# Patient Record
Sex: Female | Born: 1965 | Race: Black or African American | Hispanic: No | Marital: Single | Smoking: Never smoker
Health system: Southern US, Community
[De-identification: ages and names within clinical notes are randomized; demographics above are authoritative.]

## PROBLEM LIST (undated history)

## (undated) DIAGNOSIS — M1990 Unspecified osteoarthritis, unspecified site: Secondary | ICD-10-CM

## (undated) DIAGNOSIS — R519 Headache, unspecified: Secondary | ICD-10-CM

## (undated) DIAGNOSIS — I10 Essential (primary) hypertension: Secondary | ICD-10-CM

## (undated) HISTORY — DX: Headache, unspecified: R51.9

## (undated) HISTORY — DX: Essential (primary) hypertension: I10

## (undated) HISTORY — DX: Unspecified osteoarthritis, unspecified site: M19.90

## (undated) HISTORY — PX: NO PAST SURGERIES: SHX2092

---

## 2016-03-08 ENCOUNTER — Emergency Department
Admission: EM | Admit: 2016-03-08 | Discharge: 2016-03-08 | Disposition: A | Discharge status: Home / Self Care | Payer: No Typology Code available for payment source | Attending: Emergency Medicine | Admitting: Emergency Medicine

## 2016-03-08 DIAGNOSIS — B349 Viral infection, unspecified: Secondary | ICD-10-CM | POA: Diagnosis not present

## 2016-03-08 DIAGNOSIS — R112 Nausea with vomiting, unspecified: Secondary | ICD-10-CM | POA: Diagnosis present

## 2016-03-08 DIAGNOSIS — N390 Urinary tract infection, site not specified: Secondary | ICD-10-CM | POA: Diagnosis not present

## 2016-03-08 DIAGNOSIS — Z20828 Contact with and (suspected) exposure to other viral communicable diseases: Secondary | ICD-10-CM | POA: Insufficient documentation

## 2016-03-08 DIAGNOSIS — R197 Diarrhea, unspecified: Secondary | ICD-10-CM

## 2016-03-08 LAB — URINALYSIS, COMPLETE (UACMP) WITH MICROSCOPIC
Bilirubin Urine: NEGATIVE
GLUCOSE, UA: NEGATIVE mg/dL
KETONES UR: NEGATIVE mg/dL
Nitrite: NEGATIVE
PROTEIN: 30 mg/dL — AB
Specific Gravity, Urine: 1.028 (ref 1.005–1.030)
pH: 5 (ref 5.0–8.0)

## 2016-03-08 LAB — CBC
HEMATOCRIT: 39.6 % (ref 35.0–47.0)
HEMOGLOBIN: 13.8 g/dL (ref 12.0–16.0)
MCH: 29.7 pg (ref 26.0–34.0)
MCHC: 34.7 g/dL (ref 32.0–36.0)
MCV: 85.6 fL (ref 80.0–100.0)
Platelets: 248 10*3/uL (ref 150–440)
RBC: 4.63 MIL/uL (ref 3.80–5.20)
RDW: 14.5 % (ref 11.5–14.5)
WBC: 5 10*3/uL (ref 3.6–11.0)

## 2016-03-08 LAB — COMPREHENSIVE METABOLIC PANEL
ALT: 19 U/L (ref 14–54)
ANION GAP: 9 (ref 5–15)
AST: 23 U/L (ref 15–41)
Albumin: 4.3 g/dL (ref 3.5–5.0)
Alkaline Phosphatase: 60 U/L (ref 38–126)
BILIRUBIN TOTAL: 0.8 mg/dL (ref 0.3–1.2)
BUN: 12 mg/dL (ref 6–20)
CO2: 28 mmol/L (ref 22–32)
Calcium: 8.8 mg/dL — ABNORMAL LOW (ref 8.9–10.3)
Chloride: 100 mmol/L — ABNORMAL LOW (ref 101–111)
Creatinine, Ser: 0.71 mg/dL (ref 0.44–1.00)
GFR calc Af Amer: >60 mL/min (ref >60)
Glucose, Bld: 113 mg/dL — ABNORMAL HIGH (ref 65–99)
Potassium: 3.7 mmol/L (ref 3.5–5.1)
Sodium: 137 mmol/L (ref 135–145)
TOTAL PROTEIN: 7.9 g/dL (ref 6.5–8.1)

## 2016-03-08 LAB — LIPASE, BLOOD: Lipase: 25 U/L (ref 11–51)

## 2016-03-08 LAB — POCT PREGNANCY, URINE: Preg Test, Ur: NEGATIVE

## 2016-03-08 MED ORDER — ACETAMINOPHEN 325 MG PO TABS
650.0000 mg | ORAL_TABLET | Freq: Once | ORAL | Status: AC
  Administered 2016-03-08: 650 mg via ORAL

## 2016-03-08 MED ORDER — CEPHALEXIN 500 MG PO CAPS: 500.0000 mg | ORAL_CAPSULE | Freq: Three times a day (TID) | ORAL | 0 refills | Status: DC

## 2016-03-08 MED ORDER — ACETAMINOPHEN 325 MG PO TABS
ORAL_TABLET | ORAL | Status: AC
  Filled 2016-03-08: qty 2

## 2016-03-08 MED ORDER — ONDANSETRON 4 MG PO TBDP
4.0000 mg | ORAL_TABLET | Freq: Once | ORAL | Status: AC
  Administered 2016-03-08: 4 mg via ORAL
  Filled 2016-03-08: qty 1

## 2016-03-08 MED ORDER — OSELTAMIVIR PHOSPHATE 75 MG PO CAPS: 75.0000 mg | ORAL_CAPSULE | Freq: Two times a day (BID) | ORAL | 0 refills | Status: DC

## 2016-03-08 MED ORDER — ONDANSETRON 4 MG PO TBDP: 4.0000 mg | ORAL_TABLET | Freq: Three times a day (TID) | ORAL | 0 refills | Status: DC | PRN

## 2016-03-08 NOTE — ED Provider Notes (Signed)
[facility] Emergency Department Provider Note  ____________________________________________  Time seen: Approximately 8:21 AM  I have reviewed the triage vital signs and the nursing notes.   HISTORY  Chief Complaint Abdominal Pain    HPI Melanie Romero is a 51 y.o. female , NAD, presents to the emergency department with 2 day history of nausea, vomiting and diarrhea. Patient states she began to feel ill on Friday evening. Has been exposed to multiple sick contacts at work who have had influenza and GI illnesses. Has had generalized abdominal cramping and upset stomach which can decreased some after having a bowel movement. Bowel movements have been soft and watery without any blood, mucus or foul smell. Has also had nausea with vomiting but has been able to keep down ginger ale without difficulty. Has had some slightly increased urinary frequency but denies dysuria, hematuria, vaginal discharge, pelvic pain. Denies any fevers, chills or body aches. Has not had any nasal congestion, runny nose, cough or chest congestion but does not she's had a mild frontal headache without visual changes. Headache is not the worst headache of her life and did not have a thunderclap onset. Denies chest pain or shortness of breath. Denies any recent antibiotic use or travel.   No past medical history on file.  There are no active problems to display for this patient.   No past surgical history on file.  Prior to Admission medications   Medication Sig Start Date End Date Taking? Authorizing Provider  cephALEXin (KEFLEX) 500 MG capsule Take 1 capsule (500 mg total) by mouth 3 (three) times daily. 03/08/16   Junius Faucett L Cassandr Cederberg, PA-C  ondansetron (ZOFRAN ODT) 4 MG disintegrating tablet Take 1 tablet (4 mg total) by mouth every 8 (eight) hours as needed for nausea or vomiting. 03/08/16   Klever Twyford L Celso Granja, PA-C  oseltamivir (TAMIFLU) 75 MG capsule Take 1 capsule (75 mg total) by mouth 2 (two) times  daily. 03/08/16   Leiland Mihelich L Kaio Kuhlman, PA-C    Allergies Patient has no known allergies.  No family history on file.  Social History Social History  Substance Use Topics  . Smoking status: Not on file  . Smokeless tobacco: Not on file  . Alcohol use Not on file     Review of Systems  Constitutional: No fever/chills Eyes: No visual changes. No discharge ENT: No sore throat, Nasal congestion, runny nose. Cardiovascular: No chest pain. Respiratory: No cough or chest congestion. No shortness of breath. No wheezing.  Gastrointestinal: Positive for abdominal pain, nausea, vomiting and diarrhea.  No constipation. Genitourinary: Positive increased urinary frequency. Negative for dysuria. No hematuria. No pelvic pain, vaginal bleeding or vaginal discharge. No urinary hesitancy, urgency. Musculoskeletal: Negative for general myalgias.  Skin: Negative for rash. Neurological: Positive for headaches, but no focal weakness or numbness. 10-point ROS otherwise negative.  ____________________________________________   PHYSICAL EXAM:  VITAL SIGNS: ED Triage Vitals  Enc Vitals Group     BP 03/08/16 0711 (!) 141/82     Pulse Rate 03/08/16 0711 81     Resp 03/08/16 0711 18     Temp 03/08/16 0711 99.3 F (37.4 C)     Temp Source 03/08/16 0711 Oral     SpO2 03/08/16 0711 99 %     Weight 03/08/16 0712 219 lb (99.3 kg)     Height 03/08/16 0712 5' 5.5" (1.664 m)     Head Circumference --      Peak Flow --      Pain Score  03/08/16 0714 8     Pain Loc --      Pain Edu? --      Excl. in GC? --      Constitutional: Alert and oriented. Well appearing and in no acute distress. Eyes: Conjunctivae are normal Without icterus, injection or discharge Head: Atraumatic. ENT:      Nose: No congestion/rhinnorhea.      Mouth/Throat: Mucous membranes are moist.  Neck: Supple with full range of motion. Hematological/Lymphatic/Immunilogical: No cervical lymphadenopathy. Cardiovascular: Normal rate,  regular rhythm. Normal S1 and S2.  Good peripheral circulation. Respiratory: Normal respiratory effort without tachypnea or retractions. Lungs CTAB with breath sounds noted in all lung fields. No wheeze, rhonchi, rales. Gastrointestinal: Soft and nontender without distention or guarding in all quadrants. No rebound or rigidity. No masses. Bowel sounds grossly normal active in all quadrants. Musculoskeletal: No lower extremity tenderness nor edema.  No joint effusions. Neurologic:  Normal speech and language. No gross focal neurologic deficits are appreciated.  Skin:  Skin is warm, dry and intact. No rash noted. Psychiatric: Mood and affect are normal. Speech and behavior are normal. Patient exhibits appropriate insight and judgement.   ____________________________________________   LABS (all labs ordered are listed, but only abnormal results are displayed)  Labs Reviewed  COMPREHENSIVE METABOLIC PANEL - Abnormal; Notable for the following:       Result Value   Chloride 100 (*)    Glucose, Bld 113 (*)    Calcium 8.8 (*)    All other components within normal limits  URINALYSIS, COMPLETE (UACMP) WITH MICROSCOPIC - Abnormal; Notable for the following:    Color, Urine YELLOW (*)    APPearance HAZY (*)    Hgb urine dipstick SMALL (*)    Protein, ur 30 (*)    Leukocytes, UA LARGE (*)    Bacteria, UA RARE (*)    Squamous Epithelial / LPF 6-30 (*)    All other components within normal limits  URINE CULTURE  LIPASE, BLOOD  CBC  POC URINE PREG, ED  POCT PREGNANCY, URINE   ____________________________________________  EKG  EKG reveals normal sinus rhythm with a ventricular rate of 89 beats per minute. No acute changes or evidence of STEMI. EKG also reviewed by Dr. Gladstone Pih. ____________________________________________  RADIOLOGY  None ____________________________________________    PROCEDURES  Procedure(s) performed: None   Procedures   Medications  ondansetron  (ZOFRAN-ODT) disintegrating tablet 4 mg (4 mg Oral Given 03/08/16 0845)     ____________________________________________   INITIAL IMPRESSION / ASSESSMENT AND PLAN / ED COURSE  Pertinent labs & imaging results that were available during my care of the patient were reviewed by me and considered in my medical decision making (see chart for details).     Patient's diagnosis is consistent with Nausea, vomiting and diarrhea due to viral illness, exposure to flu and UTI. Patient's lab work returned without any significant abnormality. Urinalysis did show leukocytes with rare bacteria and coupled with the patient's increased urinary frequency, abdominal discomfort I thought it best to cover for potential UTI. Urine culture has been ordered and results will be called to the patient when available.  Patient will be discharged home with prescriptions for Keflex, Zofran ODT and Tamiflu to take as directed. Patient is to follow up with [facility] if symptoms persist past this treatment course. Patient is given ED precautions to return to the ED for any worsening or new symptoms.   ____________________________________________  FINAL CLINICAL IMPRESSION(S) / ED DIAGNOSES  Final  diagnoses:  Nausea vomiting and diarrhea  Urinary tract infection without hematuria, site unspecified  Viral illness  Exposure to the flu      NEW MEDICATIONS STARTED DURING THIS VISIT:  New Prescriptions   CEPHALEXIN (KEFLEX) 500 MG CAPSULE    Take 1 capsule (500 mg total) by mouth 3 (three) times daily.   ONDANSETRON (ZOFRAN ODT) 4 MG DISINTEGRATING TABLET    Take 1 tablet (4 mg total) by mouth every 8 (eight) hours as needed for nausea or vomiting.   OSELTAMIVIR (TAMIFLU) 75 MG CAPSULE    Take 1 capsule (75 mg total) by mouth 2 (two) times daily.         Hope Pigeon, PA-C 03/08/16 1610    Sharyn Creamer, MD 03/08/16 [phone]

## 2016-03-08 NOTE — ED Triage Notes (Signed)
Pt reports vomiting, diarrhea since Friday. Pt also reports upper abd pain and lower back pain. Denies urinary complaints.

## 2016-03-08 NOTE — ED Notes (Signed)
ED EKG was signed by Dr. Pershing ProudSchaevitz

## 2016-03-08 NOTE — ED Notes (Signed)

## 2016-03-10 LAB — URINE CULTURE: Special Requests: NORMAL

## 2018-06-08 ENCOUNTER — Other Ambulatory Visit (HOSPITAL_COMMUNITY): Payer: Self-pay | Admitting: *Deleted

## 2018-06-08 DIAGNOSIS — Z1231 Encounter for screening mammogram for malignant neoplasm of breast: Secondary | ICD-10-CM

## 2018-07-07 ENCOUNTER — Other Ambulatory Visit: Payer: Self-pay | Admitting: Family Medicine

## 2018-07-07 ENCOUNTER — Other Ambulatory Visit: Payer: Self-pay

## 2018-07-07 ENCOUNTER — Ambulatory Visit
Admission: RE | Admit: 2018-07-07 | Discharge: 2018-07-07 | Disposition: A | Discharge status: Home / Self Care | Payer: No Typology Code available for payment source | Source: Ambulatory Visit | Attending: Family Medicine | Admitting: Family Medicine

## 2018-07-07 DIAGNOSIS — M25562 Pain in left knee: Secondary | ICD-10-CM

## 2018-08-30 ENCOUNTER — Ambulatory Visit (HOSPITAL_COMMUNITY): Payer: No Typology Code available for payment source

## 2018-08-31 ENCOUNTER — Other Ambulatory Visit (HOSPITAL_COMMUNITY): Payer: Self-pay | Admitting: *Deleted

## 2018-08-31 DIAGNOSIS — Z1231 Encounter for screening mammogram for malignant neoplasm of breast: Secondary | ICD-10-CM

## 2018-12-15 ENCOUNTER — Ambulatory Visit (HOSPITAL_COMMUNITY): Payer: No Typology Code available for payment source

## 2019-03-02 ENCOUNTER — Ambulatory Visit (HOSPITAL_COMMUNITY): Payer: No Typology Code available for payment source

## 2019-11-22 ENCOUNTER — Other Ambulatory Visit: Payer: Self-pay

## 2019-11-22 ENCOUNTER — Other Ambulatory Visit: Payer: Self-pay | Admitting: Family Medicine

## 2019-11-22 ENCOUNTER — Ambulatory Visit
Admission: RE | Admit: 2019-11-22 | Discharge: 2019-11-22 | Disposition: A | Discharge status: Home / Self Care | Payer: No Typology Code available for payment source | Source: Ambulatory Visit | Attending: Family Medicine | Admitting: Family Medicine

## 2019-11-22 DIAGNOSIS — M542 Cervicalgia: Secondary | ICD-10-CM

## 2020-04-11 ENCOUNTER — Other Ambulatory Visit: Payer: Self-pay | Admitting: Student in an Organized Health Care Education/Training Program

## 2020-04-11 DIAGNOSIS — G43909 Migraine, unspecified, not intractable, without status migrainosus: Secondary | ICD-10-CM

## 2020-04-26 ENCOUNTER — Ambulatory Visit
Admission: RE | Admit: 2020-04-26 | Discharge: 2020-04-26 | Disposition: A | Discharge status: Home / Self Care | Payer: No Typology Code available for payment source | Source: Ambulatory Visit | Attending: Student in an Organized Health Care Education/Training Program | Admitting: Student in an Organized Health Care Education/Training Program

## 2020-04-26 DIAGNOSIS — G43909 Migraine, unspecified, not intractable, without status migrainosus: Secondary | ICD-10-CM

## 2020-05-28 ENCOUNTER — Other Ambulatory Visit: Payer: Self-pay | Admitting: Student in an Organized Health Care Education/Training Program

## 2020-05-28 ENCOUNTER — Encounter: Payer: Self-pay | Admitting: Neurology

## 2020-05-28 DIAGNOSIS — I83813 Varicose veins of bilateral lower extremities with pain: Secondary | ICD-10-CM

## 2020-05-31 ENCOUNTER — Inpatient Hospital Stay: Admission: RE | Admit: 2020-05-31 | Payer: No Typology Code available for payment source | Source: Ambulatory Visit

## 2020-06-10 ENCOUNTER — Ambulatory Visit
Admission: RE | Admit: 2020-06-10 | Discharge: 2020-06-10 | Disposition: A | Discharge status: Home / Self Care | Payer: No Typology Code available for payment source | Source: Ambulatory Visit | Attending: Student in an Organized Health Care Education/Training Program | Admitting: Student in an Organized Health Care Education/Training Program

## 2020-06-10 DIAGNOSIS — I83813 Varicose veins of bilateral lower extremities with pain: Secondary | ICD-10-CM

## 2020-08-06 NOTE — Progress Notes (Signed)
NEUROLOGY CONSULTATION NOTE  Melanie Romero MRN: [id number] DOB: 11-04-1965  Referring provider: [name], NP Primary care provider: [name], NP  Reason for consult:  migraine  Assessment/Plan:   Chronic migraine without aura, without status migrainosus, not intractable - complicated by medication-overuse.  Improvement likely due to discontinuing Excedrin and optimizing blood pressure control.   Would not start a preventative at this time  Limit use of pain relievers to no more than 2 days out of week to prevent risk of rebound or medication-overuse headache. Manage life style modification:  caffeine cessation, increased water intake, not to skip meals, monitor for triggers, routine exercise, proper sleep hygiene If headaches increase, follow up.   Subjective:  Melanie Romero is a 55 year old right-handed female with HTN, cervical degenerative disc disease who presents for migraines.  History supplemented by referring provider's note.  She has had migraines since her early 74s.  They are a severe pounding pain across the forehead and then moved to crown and right side of head.  No aura.  Associated with photophobia, phonophobia and mild blurred vision but no nausea, vomiting, numbness or tingling.  Will take Excedrin immediately and last 6 hours.  They have been daily since her mid-40s.  Taking Excedrin daily.  No known triggers.  Nothing specifically relieves them other than Excedrin.  She had a CT of head on 04/26/2020 personally reviewed showed incidental scattered dural calcifications but overall unremarkable.    Stopped Excedrin a month ago.  Started amlodipine two weeks ago and hasn't had a migraine since.  She has history of degenerative disease of the cervical spine.  Cervical spine X-ray on 11/22/2019 personally reviewed showed mild degenerative facet disease bilaterally with early multilevel neural foraminal narrowing from C3-4 through C5-6.  Current  NSAIDS/analgesics:  Excedrin migraine, meloxicam, Norco Current triptans:  none Current ergotamine:  none Current anti-emetic:  none Current muscle relaxants:  tizanidine 4mg  QD PRN Current Antihypertensive medications:  amlodipine, HCTZ Current Antidepressant medications:  none Current Anticonvulsant medications:  gabapentin 300mg  QD Current anti-CGRP:  none Current Vitamins/Herbal/Supplements:  MVI, fish oil, MagOx 400mg  QD Current Antihistamines/Decongestants:  none Other therapy:  none Hormone/birth control:  none  Past NSAIDS/analgesics:  none Past abortive triptans:  none Past abortive ergotamine:  none Past muscle relaxants:  none Past anti-emetic:  Zofran Past antihypertensive medications:  none Past antidepressant medications:  ne Past anticonvulsant medications:  none Past anti-CGRP:  none Past vitamins/Herbal/Supplements:  none Past antihistamines/decongestants:  none Other past therapies:  none  Caffeine:  2 cups coffee.  Drinks energy drinks twice a week Diet:  Sometimes skips meals.  Not enough water Exercise:  No Depression:  No.  Last year felt depressed after getting COVID;  Other pain:  Chronic bilateral knee pain/osteoarthritis Sleep hygiene:  Poor.  Problems falling asleep.  Works 12 hour shift overnight 4 times out of week.   Family history of headache:  Not that she is aware of      PAST MEDICAL HISTORY: Past Medical History:  Diagnosis Date   Arthritis    Bilateral legs   Headache    Hypertension    MEDICATIONS: Current Outpatient Medications on File Prior to Visit  Medication Sig Dispense Refill   cephALEXin (KEFLEX) 500 MG capsule Take 1 capsule (500 mg total) by mouth 3 (three) times daily. 21 capsule 0   ondansetron (ZOFRAN ODT) 4 MG disintegrating tablet Take 1 tablet (4 mg total) by mouth every 8 (eight) hours as needed for nausea  or vomiting. 20 tablet 0   oseltamivir (TAMIFLU) 75 MG capsule Take 1 capsule (75 mg total) by mouth 2 (two)  times daily. 10 capsule 0   No current facility-administered medications on file prior to visit.    ALLERGIES: No Known Allergies  FAMILY HISTORY: No family history on file.  Objective:  Blood pressure 114/80, pulse 87, height 5\' 6"  (1.676 m), weight 235 lb 12.8 oz (107 kg), SpO2 98 %. General: No acute distress.  Patient appears well-groomed.   Head:  Normocephalic/atraumatic Eyes:  fundi examined but not visualized Neck: supple, no paraspinal tenderness, full range of motion Back: No paraspinal tenderness Heart: regular rate and rhythm Lungs: Clear to auscultation bilaterally. Vascular: No carotid bruits. Neurological Exam: Mental status: alert and oriented to person, place, and time, recent and remote memory intact, fund of knowledge intact, attention and concentration intact, speech fluent and not dysarthric, language intact. Cranial nerves: CN I: not tested CN II: pupils equal, round and reactive to light, visual fields intact CN III, IV, VI:  full range of motion, no nystagmus, no ptosis CN V: facial sensation intact. CN VII: upper and lower face symmetric CN VIII: hearing intact CN IX, X: gag intact, uvula midline CN XI: sternocleidomastoid and trapezius muscles intact CN XII: tongue midline Bulk & Tone: normal, no fasciculations. Motor:  muscle strength 5/5 throughout Sensation:  Pinprick, temperature and vibratory sensation intact. Deep Tendon Reflexes:  2+ throughout,  toes downgoing.   Finger to nose testing:  Without dysmetria.   Heel to shin:  Without dysmetria.   Gait:  Normal station and stride.  Romberg negative.    Thank you for allowing me to take part in the care of this patient.  , DO  CC: Shon Millet, NP

## 2020-08-07 ENCOUNTER — Ambulatory Visit (INDEPENDENT_AMBULATORY_CARE_PROVIDER_SITE_OTHER): Payer: No Typology Code available for payment source | Admitting: Neurology

## 2020-08-07 ENCOUNTER — Encounter: Payer: Self-pay | Admitting: Neurology

## 2020-08-07 ENCOUNTER — Other Ambulatory Visit: Payer: Self-pay

## 2020-08-07 VITALS — BP 114/80 | HR 87 | Ht 66.0 in | Wt 235.8 lb

## 2020-08-07 DIAGNOSIS — G4440 Drug-induced headache, not elsewhere classified, not intractable: Secondary | ICD-10-CM

## 2020-08-07 DIAGNOSIS — G43709 Chronic migraine without aura, not intractable, without status migrainosus: Secondary | ICD-10-CM

## 2020-08-07 NOTE — Patient Instructions (Signed)
  Limit use of pain relievers to no more than 2 days out of the week.  These medications include acetaminophen, NSAIDs (ibuprofen/Advil/Motrin, naproxen/Aleve, triptans (Imitrex/sumatriptan), Excedrin, and narcotics.  This will help reduce risk of rebound headaches. Be aware of common food triggers:  - Caffeine:  coffee, black tea, cola, Mt. Dew  - Chocolate  - Dairy:  aged cheeses (brie, blue, cheddar, gouda, Parmasan, provolone, romano, Swiss, etc), chocolate milk, buttermilk, sour cream, limit eggs and yogurt  - Nuts, peanut butter  - Alcohol  - Cereals/grains:  FRESH breads (fresh bagels, sourdough, doughnuts), yeast productions  - Processed/canned/aged/cured meats (pre-packaged deli meats, hotdogs)  - MSG/glutamate:  soy sauce, flavor enhancer, pickled/preserved/marinated foods  - Sweeteners:  aspartame (Equal, Nutrasweet).  Sugar and Splenda are okay  - Vegetables:  legumes (lima beans, lentils, snow peas, fava beans, pinto peans, peas, garbanzo beans), sauerkraut, onions, olives, pickles  - Fruit:  avocados, bananas, citrus fruit (orange, lemon, grapefruit), mango  - Other:  Frozen meals, macaroni and cheese Routine exercise Stay adequately hydrated (aim for 64 oz water daily) Keep headache diary Maintain proper stress management Maintain proper sleep hygiene Do not skip meals Consider supplements:  magnesium citrate 400mg daily, riboflavin 400mg daily, coenzyme Q10 100mg three times daily.  

## 2020-10-18 ENCOUNTER — Other Ambulatory Visit: Payer: Self-pay

## 2020-10-18 DIAGNOSIS — I83813 Varicose veins of bilateral lower extremities with pain: Secondary | ICD-10-CM

## 2020-10-28 NOTE — Progress Notes (Signed)
[facility] [place]  ASSESSMENT / PLAN: Melanie Romero is a 55 y.o. female with chronic venous insufficiency of bilateral lower extremity causing edema, varicose and reticular veins (C3 disease).  Venous duplex is significant for right GSV reflux, but vein does not appear amenable to ablation based on size.  Recommend compression and elevation for symptomatic relief. Follow up with me as needed.  CHIEF COMPLAINT: leg swelling and fatigue  HISTORY OF PRESENT ILLNESS: Melanie Romero is a 55 y.o. female who presents to clinic for evaluation of leg discomfort.  The patient reports her legs feel heavy and tired at the end of the day.  She experiences worsening swelling as the day goes on.  She has tried compression garments over the past several weeks with some relief.  She has noticed scattered varicosities and reticular veins across her legs bilaterally.  Past Medical History:  Diagnosis Date   Arthritis    Bilateral legs   Headache    Hypertension     History reviewed. No pertinent surgical history.  Family History  Problem Relation Age of Onset   Stroke Mother    Stroke Father    Neuropathy Sister    Stroke Brother     Social History   Socioeconomic History   Marital status: Single    Spouse name: Not on file   Number of children: Not on file   Years of education: Not on file   Highest education level: Not on file  Occupational History   Not on file  Tobacco Use   Smoking status: Never   Smokeless tobacco: Never  Vaping Use   Vaping Use: Never used  Substance and Sexual Activity   Alcohol use: Never   Drug use: Never   Sexual activity: Not on file  Other Topics Concern   Not on file  Social History Narrative   Not on file   Social Determinants of Health   Financial Resource Strain: Not on file  Food Insecurity: Not on file  Transportation Needs: Not on file  Physical Activity: Not on file  Stress: Not on file  Social Connections: Not  on file  Intimate Partner Violence: Not on file    No Known Allergies  Current Outpatient Medications  Medication Sig Dispense Refill   amLODipine (NORVASC) 5 MG tablet Take 5 mg by mouth daily.     gabapentin (NEURONTIN) 300 MG capsule Take 300 mg by mouth daily.     hydrochlorothiazide (HYDRODIURIL) 50 MG tablet Take 50 mg by mouth daily.     HYDROcodone-acetaminophen (NORCO) 7.5-325 MG tablet Take 1 tablet by mouth every 4 (four) hours as needed.     meloxicam (MOBIC) 15 MG tablet Take 15 mg by mouth daily.     No current facility-administered medications for this visit.    REVIEW OF SYSTEMS:  [X]  denotes positive finding, [ ]  denotes negative finding Cardiac  Comments:  Chest pain or chest pressure:    Shortness of breath upon exertion:    Short of breath when lying flat:    Irregular heart rhythm:        Vascular    Pain in calf, thigh, or hip brought on by ambulation:    Pain in feet at night that wakes you up from your sleep:     Blood clot in your veins:    Leg swelling:         Pulmonary    Oxygen at home:    Productive cough:  Wheezing:         Neurologic    Sudden weakness in arms or legs:     Sudden numbness in arms or legs:     Sudden onset of difficulty speaking or slurred speech:    Temporary loss of vision in one eye:     Problems with dizziness:         Gastrointestinal    Blood in stool:     Vomited blood:         Genitourinary    Burning when urinating:     Blood in urine:        Psychiatric    Major depression:         Hematologic    Bleeding problems:    Problems with blood clotting too easily:        Skin    Rashes or ulcers:        Constitutional    Fever or chills:      PHYSICAL EXAM Vitals:   10/29/20 1300  BP: 126/83  Pulse: 70  Resp: 20  Temp: 98.1 F (36.7 C)  SpO2: 100%  Weight: 232 lb (105.2 kg)  Height: 5\' 6"  (1.676 m)    Constitutional: well appearing. no distress. Appears well nourished.  Neurologic: CN  intact. no focal findings. no sensory loss. Psychiatric:  Mood and affect symmetric and appropriate. Eyes:  No icterus. No conjunctival pallor. Ears, nose, throat:  mucous membranes moist. Midline trachea.  Cardiac: regular rate and rhythm.  Respiratory:  unlabored. Abdominal:  soft, non-tender, non-distended.  Peripheral vascular: 2+ DP pulses.  Small, scattered varicosities and reticular veins across the thighs and calves bilaterally. Extremity: no edema. no cyanosis. no pallor.  Skin: no gangrene. no ulceration.  Lymphatic: no Stemmer's sign. no palpable lymphadenopathy.  PERTINENT LABORATORY AND RADIOLOGIC DATA  Most recent CBC CBC Latest Ref Rng & Units 03/08/2016  WBC 3.6 - 11.0 K/uL 5.0  Hemoglobin 12.0 - 16.0 g/dL 05/06/2016  Hematocrit 83.1 - 47.0 % 39.6  Platelets 150 - 440 K/uL 248     Most recent CMP CMP Latest Ref Rng & Units 03/08/2016  Glucose 65 - 99 mg/dL 05/06/2016)  BUN 6 - 20 mg/dL 12  Creatinine 616(W - 7.37 mg/dL 1.06  Sodium 2.69 - 485 mmol/L 137  Potassium 3.5 - 5.1 mmol/L 3.7  Chloride 101 - 111 mmol/L 100(L)  CO2 22 - 32 mmol/L 28  Calcium 8.9 - 10.3 mg/dL 462)  Total Protein 6.5 - 8.1 g/dL 7.9  Total Bilirubin 0.3 - 1.2 mg/dL 0.8  Alkaline Phos 38 - 126 U/L 60  AST 15 - 41 U/L 23  ALT 14 - 54 U/L 19    Lower Venous Reflux Study   Patient Name:  Melanie Romero  Date of Exam:   10/29/2020  Medical Rec #: 10/31/2020      Accession #:    [id number]  Date of Birth: 1965-04-14      Patient Gender: F  Patient Age:   89 years  Exam Location:  [facility]  Procedure:      VAS [name] LOWER EXTREMITY VENOUS REFLUX  Referring Phys: [place]    ---------------------------------------------------------------------------  -----     Indications: Varicosities.  Other Indications: Heaviness and pain with walking.   Performing Technologist: [name] RVT      Examination Guidelines: A complete evaluation includes B-mode imaging,  spectral   Doppler, color Doppler, and power Doppler as needed of all accessible  portions  of each vessel. Bilateral testing is considered an integral part of a  complete  examination. Limited examinations for reoccurring indications may be  performed  as noted. The reflux portion of the exam is performed with the patient in  reverse Trendelenburg.  Significant venous reflux is defined as >500 ms in the superficial venous  system, and >1 second in the deep venous system.      Venous Reflux Times  +--------------+---------+------+-----------+------------+-------------+  RIGHT         Reflux NoRefluxReflux TimeDiameter cmsComments                               Yes                                        +--------------+---------+------+-----------+------------+-------------+  CFV                     yes   >1 second                            +--------------+---------+------+-----------+------------+-------------+  FV mid        no                                                   +--------------+---------+------+-----------+------------+-------------+  Popliteal     no                                                   +--------------+---------+------+-----------+------------+-------------+  GSV at SFJ              yes                 0.47                   +--------------+---------+------+-----------+------------+-------------+  GSV prox thighno                            0.48                   +--------------+---------+------+-----------+------------+-------------+  GSV mid thigh no                            0.33                   +--------------+---------+------+-----------+------------+-------------+  GSV dist thigh          yes    >500 ms      0.39    out of fascia  +--------------+---------+------+-----------+------------+-------------+  GSV at knee             yes    >500 ms      0.21                    +--------------+---------+------+-----------+------------+-------------+  GSV prox calf  NV             +--------------+---------+------+-----------+------------+-------------+  GSV mid calf                                        NV             +--------------+---------+------+-----------+------------+-------------+  SSV Pop Fossa no                            0.39                   +--------------+---------+------+-----------+------------+-------------+  SSV prox calf no                            0.33                   +--------------+---------+------+-----------+------------+-------------+  SSV mid calf  no                            0.35                   +--------------+---------+------+-----------+------------+-------------+  AASV prx      no                            0.37    tortuous       +--------------+---------+------+-----------+------------+-------------+  AASV mid                yes    >500 ms      0.39                   +--------------+---------+------+-----------+------------+-------------+  AASV m-d                yes    >500 ms                             +--------------+---------+------+-----------+------------+-------------+      +--------------+---------+------+-----------+------------+--------+  LEFT          Reflux NoRefluxReflux TimeDiameter cmsComments                          Yes                                   +--------------+---------+------+-----------+------------+--------+  CFV                     yes   >1 second                       +--------------+---------+------+-----------+------------+--------+  FV mid        no                                              +--------------+---------+------+-----------+------------+--------+  Popliteal     no                                               +--------------+---------+------+-----------+------------+--------+  GSV at [facility]    no                            0.56              +--------------+---------+------+-----------+------------+--------+  GSV prox thighno                            0.46              +--------------+---------+------+-----------+------------+--------+  GSV mid thigh no                            0.34              +--------------+---------+------+-----------+------------+--------+  GSV dist thighno                            0.39              +--------------+---------+------+-----------+------------+--------+  GSV at knee   no                            0.35              +--------------+---------+------+-----------+------------+--------+  GSV prox calf no                            0.24    NV        +--------------+---------+------+-----------+------------+--------+  GSV mid calf  no                            0.23              +--------------+---------+------+-----------+------------+--------+  SSV Pop Fossa no                            0.31              +--------------+---------+------+-----------+------------+--------+  SSV prox calf no                            0.18              +--------------+---------+------+-----------+------------+--------+  SSV mid calf  no                            0.26              +--------------+---------+------+-----------+------------+--------+  AASV p        no                            0.27              +--------------+---------+------+-----------+------------+--------+          Summary:  Right:  - No evidence of deep vein thrombosis seen in the right lower extremity,  from the common femoral through the popliteal veins.  - No evidence of superficial venous reflux seen in the right short  saphenous vein.  - Venous reflux is noted in the right common femoral vein.  - Venous reflux is noted in  the right sapheno-femoral junction.  -  Venous reflux is noted in the right greater saphenous vein in the thigh  to medial knee.   - Venous reflux is noted in the AASV mid thigh area.     Left:  - No evidence of deep vein thrombosis seen in the left lower extremity,  from the common femoral through the popliteal veins.  - No evidence of superficial venous reflux seen in the left greater  saphenous vein.  - No evidence of superficial venous reflux seen in the left short  saphenous vein.  - Venous reflux is noted in the left common femoral vein.     *See table(s) above for measurements and observations.   Rande Brunt. Lenell Antu, MD [facility] Phone Number: [phone] 10/29/2020 2:41 PM  Total time spent on preparing this encounter including chart review, data review, collecting history, examining the patient, coordinating care for this new patient, 45 minutes.  Portions of this report may have been transcribed using voice recognition software.  Every effort has been made to ensure accuracy; however, inadvertent computerized transcription errors may still be present.

## 2020-10-29 ENCOUNTER — Other Ambulatory Visit: Payer: Self-pay

## 2020-10-29 ENCOUNTER — Encounter: Payer: Self-pay | Admitting: Vascular Surgery

## 2020-10-29 ENCOUNTER — Ambulatory Visit (HOSPITAL_COMMUNITY)
Admission: RE | Admit: 2020-10-29 | Discharge: 2020-10-29 | Disposition: A | Discharge status: Home / Self Care | Payer: No Typology Code available for payment source | Source: Ambulatory Visit | Attending: Vascular Surgery | Admitting: Vascular Surgery

## 2020-10-29 ENCOUNTER — Ambulatory Visit (INDEPENDENT_AMBULATORY_CARE_PROVIDER_SITE_OTHER): Payer: No Typology Code available for payment source | Admitting: Vascular Surgery

## 2020-10-29 VITALS — BP 126/83 | HR 70 | Temp 98.1°F | Resp 20 | Ht 66.0 in | Wt 232.0 lb

## 2020-10-29 DIAGNOSIS — I872 Venous insufficiency (chronic) (peripheral): Secondary | ICD-10-CM

## 2020-10-29 DIAGNOSIS — I83813 Varicose veins of bilateral lower extremities with pain: Secondary | ICD-10-CM | POA: Insufficient documentation

## 2021-05-26 ENCOUNTER — Telehealth: Payer: Self-pay

## 2021-05-26 NOTE — Telephone Encounter (Signed)
NOTES SCANNED TO REFERRAL 

## 2021-06-04 ENCOUNTER — Ambulatory Visit: Payer: No Typology Code available for payment source | Admitting: Internal Medicine

## 2021-06-04 NOTE — Progress Notes (Deleted)
Cardiology Office Note:    Date:  06/04/2021   ID:  Melanie Romero, DOB [birth year], MRN [id number]  PCP:  [name], NP (Inactive)   [facility] Providers Cardiologist:  None { Click to update primary MD,subspecialty MD or APP then REFRESH:1}    Referring MD: Laruth Bouchard, MD   No chief complaint on file. HTN, family hx of CAD  History of Present Illness:    Melanie Romero is a 56 y.o. female with a hx of HTN, family hx of CAD     Past Medical History:  Diagnosis Date   Arthritis    Bilateral legs   Headache    Hypertension     No past surgical history on file.  Current Medications: No outpatient medications have been marked as taking for the 06/04/21 encounter (Appointment) with Maisie Fus, MD.     Allergies:   Patient has no known allergies.   Social History   Socioeconomic History   Marital status: Single    Spouse name: Not on file   Number of children: Not on file   Years of education: Not on file   Highest education level: Not on file  Occupational History   Not on file  Tobacco Use   Smoking status: Never   Smokeless tobacco: Never  Vaping Use   Vaping Use: Never used  Substance and Sexual Activity   Alcohol use: Never   Drug use: Never   Sexual activity: Not on file  Other Topics Concern   Not on file  Social History Narrative   Not on file   Social Determinants of Health   Financial Resource Strain: Not on file  Food Insecurity: Not on file  Transportation Needs: Not on file  Physical Activity: Not on file  Stress: Not on file  Social Connections: Not on file     Family History: The patient's ***family history includes Neuropathy in her sister; Stroke in her brother, father, and mother.  ROS:   Please see the history of present illness.    *** All other systems reviewed and are negative.  EKGs/Labs/Other Studies Reviewed:    The following studies were reviewed today: ***  EKG:  EKG is *** ordered today.  The ekg  ordered today demonstrates ***  Recent Labs: No results found for requested labs within last 8760 hours.  Recent Lipid Panel No results found for: CHOL, TRIG, HDL, CHOLHDL, VLDL, LDLCALC, LDLDIRECT   Risk Assessment/Calculations:   {Does this patient have ATRIAL FIBRILLATION?:[phone]}       Physical Exam:    VS:  There were no vitals taken for this visit.    Wt Readings from Last 3 Encounters:  10/29/20 232 lb (105.2 kg)  08/07/20 235 lb 12.8 oz (107 kg)  03/08/16 219 lb (99.3 kg)     GEN: *** Well nourished, well developed in no acute distress HEENT: Normal NECK: No JVD; No carotid bruits LYMPHATICS: No lymphadenopathy CARDIAC: ***RRR, no murmurs, rubs, gallops RESPIRATORY:  Clear to auscultation without rales, wheezing or rhonchi  ABDOMEN: Soft, non-tender, non-distended MUSCULOSKELETAL:  No edema; No deformity  SKIN: Warm and dry NEUROLOGIC:  Alert and oriented x 3 PSYCHIATRIC:  Normal affect   ASSESSMENT:    No diagnosis found. PLAN:    In order of problems listed above:  ***      {Are you ordering a CV Procedure (e.g. stress test, cath, DCCV, TEE, etc)?   Press F2        :[id number]}  Medication Adjustments/Labs and Tests Ordered: Current medicines are reviewed at length with the patient today.  Concerns regarding medicines are outlined above.  No orders of the defined types were placed in this encounter.  No orders of the defined types were placed in this encounter.   There are no Patient Instructions on file for this visit.   Signed, Maisie Fus, MD  06/04/2021 8:29 AM    [facility]

## 2021-06-09 ENCOUNTER — Ambulatory Visit (INDEPENDENT_AMBULATORY_CARE_PROVIDER_SITE_OTHER): Payer: No Typology Code available for payment source | Admitting: Internal Medicine

## 2021-06-09 ENCOUNTER — Encounter: Payer: Self-pay | Admitting: Internal Medicine

## 2021-06-09 VITALS — BP 112/76 | HR 85 | Ht 66.0 in | Wt 227.8 lb

## 2021-06-09 DIAGNOSIS — Z8249 Family history of ischemic heart disease and other diseases of the circulatory system: Secondary | ICD-10-CM

## 2021-06-09 DIAGNOSIS — Z13220 Encounter for screening for lipoid disorders: Secondary | ICD-10-CM

## 2021-06-09 NOTE — Progress Notes (Signed)
?Cardiology Office Note:   ? ?Date:  06/09/2021  ? ?ID:  Melanie Romero, DOB 1965/10/11, MRN [id number] ? ?PCP:  [name], NP (Inactive) ?  ?[facility] Providers ?Cardiologist:  None    ? ?Referring MD: Laruth Bouchard, MD  ? ?No chief complaint on file. ?HTN, family hx of CAD ? ?History of Present Illness:   ? ?Melanie Romero is a 56 y.o. female with a hx of arthritis, migraine, referral for HTN and family hx of CAD ? ?Her brother died of heart failure at 2. He had significant CAD. Her oldest brother had heart failure ; he had diabetes, and hypertension as well as a stroke. Died at 3. Mother died at 72 years old, unknown cause.; noted to have heart disease. Her sister has cardiomyopathy.  ? ?She denies angina, dyspnea on exertion, lower extremity edema, PND or orthopnea.  She notes occasional palpitations. This occurs every once in a while. 1-2x per month. She gets left arm tingling. No syncope. Notes loud snoring. ? ? ?Past Medical History:  ?Diagnosis Date  ? Arthritis   ? Bilateral legs  ? Headache   ? Hypertension   ? ? ?No past surgical history on file. ? ?Current Medications: ?Current Outpatient Medications on File Prior to Visit  ?Medication Sig Dispense Refill  ? amLODipine (NORVASC) 5 MG tablet Take 5 mg by mouth daily.    ? gabapentin (NEURONTIN) 300 MG capsule Take 300 mg by mouth daily.    ? hydrochlorothiazide (HYDRODIURIL) 50 MG tablet Take 50 mg by mouth daily.    ? HYDROcodone-acetaminophen (NORCO) 7.5-325 MG tablet Take 1 tablet by mouth every 4 (four) hours as needed.    ? ibuprofen (ADVIL) 800 MG tablet ibuprofen 800 mg tablet ? TAKE 1 TABLET 3 TIMES A DAY BY ORAL ROUTE.    ? meloxicam (MOBIC) 15 MG tablet Take 15 mg by mouth daily.    ? tiZANidine (ZANAFLEX) 4 MG tablet Take 4 mg by mouth daily as needed.    ? ?No current facility-administered medications on file prior to visit.  ? ? ? ? ?Allergies:   Patient has no known allergies.  ? ?Social History  ? ?Socioeconomic History  ? Marital  status: Single  ?  Spouse name: Not on file  ? Number of children: Not on file  ? Years of education: Not on file  ? Highest education level: Not on file  ?Occupational History  ? Not on file  ?Tobacco Use  ? Smoking status: Never  ? Smokeless tobacco: Never  ?Vaping Use  ? Vaping Use: Never used  ?Substance and Sexual Activity  ? Alcohol use: Never  ? Drug use: Never  ? Sexual activity: Not on file  ?Other Topics Concern  ? Not on file  ?Social History Narrative  ? Not on file  ? ?Social Determinants of Health  ? ?Financial Resource Strain: Not on file  ?Food Insecurity: Not on file  ?Transportation Needs: Not on file  ?Physical Activity: Not on file  ?Stress: Not on file  ?Social Connections: Not on file  ?  ? ?Family History: ?The patient's family history includes Neuropathy in her sister; Stroke in her brother, father, and mother. Per above ? ?ROS:   ?Please see the history of present illness.    ? All other systems reviewed and are negative. ? ?EKGs/Labs/Other Studies Reviewed:   ? ?The following studies were reviewed today: ? ? ?EKG:  EKG is  ordered today.  The ekg ordered today demonstrates  ? ?  NSR,  inferior axis PVC ? ?Recent Labs: ?No results found for requested labs within last 8760 hours.  ?Recent Lipid Panel ?No results found for: CHOL, TRIG, HDL, CHOLHDL, VLDL, LDLCALC, LDLDIRECT ? ? ?Risk Assessment/Calculations:   ?  ? ?    ? ?Physical Exam:   ? ?VS:   ? ?Vitals:  ? 06/09/21 0951  ?BP: 112/76  ?Pulse: 85  ?SpO2: 99%  ? ? ? ?Wt Readings from Last 3 Encounters:  ?06/09/21 227 lb 12.8 oz (103.3 kg)  ?10/29/20 232 lb (105.2 kg)  ?08/07/20 235 lb 12.8 oz (107 kg)  ?  ? ?GEN:  Well nourished, well developed in no acute distress ?HEENT: Normal ?NECK: No JVD; No carotid bruits ?LYMPHATICS: No lymphadenopathy ?CARDIAC: RRR, no murmurs, rubs, gallops ?RESPIRATORY:  Clear to auscultation without rales, wheezing or rhonchi  ?ABDOMEN: Soft, non-tender, non-distended ?MUSCULOSKELETAL:  No edema; No deformity   ?SKIN: Warm and dry ?NEUROLOGIC:  Alert and oriented x 3 ?PSYCHIATRIC:  Normal affect  ? ?ASSESSMENT:   ? ?Family Hx: She has family hx of CHF. Her brothers risk may be related to ischemic heart disease. Unknown if her mother had a familial CM. Her sister may as well. Will get an echo to assess for cardiomyopathy ? ?Cardiac risk assessment:  Recommend to continue with lifestyle modification and CVD risk mitigation (yearly A1c, lipid monitoring). Will obtain lipids to risk stratify and determine necessity of statin medication. ? ?HTN: continue norvasc 5 mg daily and HCTz 50 mg daily. Recommend a sleep study ? ? ?PLAN:   ? ?In order of problems listed above: ? ?Fasting lipids ?TTE ?Recommend sleep study per her PCP ?Follow up 3 months ? ?   ? ?   ? ? ?Medication Adjustments/Labs and Tests Ordered: ?Current medicines are reviewed at length with the patient today.  Concerns regarding medicines are outlined above.  ?Orders Placed This Encounter  ?Procedures  ? Lipid panel  ? EKG 12-Lead  ? ECHOCARDIOGRAM COMPLETE  ? ?No orders of the defined types were placed in this encounter. ? ? ?Patient Instructions  ?Medication Instructions:  ?Your physician recommends that you continue on your current medications as directed. Please refer to the Current Medication list given to you today. ? ?*If you need a refill on your cardiac medications before your next appointment, please call your pharmacy* ? ? ?Lab Work: ?Please return for FASTING labs (Lipid) ? ?Our in office lab hours are Monday-Friday 8:00-4:00, closed for lunch 12:45-1:45 pm.  No appointment needed. ? ?[facility] locations: ?  ?[place] ?- [address] (Dr. Verna CzechBranch's office) ?- [address] ([facility]) ?- [address] ?- [address] ?  ?[place] ?- [address]  ?  ?[place]  ?- [address]  ?  ?[place] ?- [address] ?- [id number] ?  ?[place]  ?- [address] ?- [address] ([facility]) ? ?If you have labs (blood work) drawn today and your tests are completely normal, you will receive your results only by: ?MyChart Message (if you have MyChart) OR ?A paper copy in the mail ?If you have any lab test that is abnormal or we need to change your treatment, we will call you to review the results. ? ? ?Testing/Procedures: ?Your physician has requested that you have an echocardiogram. Echocardiography is a painless test that uses sound waves  to create images of your heart. It provides your doctor with information about the size and shape of your heart and how well your heart?s chambers and valves are working. This procedure takes approximately one hour. There are no restrictions for this procedure. ? ?Follow-Up: ?At [facility], you and your health needs are our priority.  As part of our continuing mission to provide you with exceptional heart care, we have created designated Provider Care Teams.  These Care Teams include your primary Cardiologist (physician) and Advanced Practice Providers (APPs -  Physician Assistants and Nurse Practitioners) who all work together to provide you with the care you need, when you need it. ? ?We recommend signing up for the patient portal called "MyChart".  Sign up information is provided on this After Visit Summary.  MyChart is used to connect with patients for Virtual Visits (Telemedicine).  Patients are able to view lab/test results, encounter notes, upcoming appointments, etc.  Non-urgent messages can be sent to your provider as well.   ?To learn more about what you can do with MyChart, go to [email or web address].   ? ?Your next appointment:   ?3 month(s) ? ?The format for your next appointment:   ?In Person ? ?Provider:   ?Dr. Wyline Mood ? ?Important Information About Sugar ? ? ? ? ? ? ? ? ?Please discuss a sleep study with your primary care provider. Please come back to check your  cholesterol , please don't eat prior to the test. We will call you with the echocardiogram results  ? ?Signed, ?Maisie Fus, MD  ?06/09/2021 10:20 AM    ?[facility] ?

## 2021-06-09 NOTE — Patient Instructions (Signed)
Medication Instructions:  ?Your physician recommends that you continue on your current medications as directed. Please refer to the Current Medication list given to you today. ? ?*If you need a refill on your cardiac medications before your next appointment, please call your pharmacy* ? ? ?Lab Work: ?Please return for FASTING labs (Lipid) ? ?Our in office lab hours are Monday-Friday 8:00-4:00, closed for lunch 12:45-1:45 pm.  No appointment needed. ? ?[facility] locations: ?  ? ?- [address] (Dr. Verna Czech office) ?- [address] ([facility]) ?- [address] ?- [address] ?  ?[place] ?- [address]  ?  ?[place]  ?- [address]  ?  ?[place] ?- [address] ?- [id number] ?  ?[place]  ?- [address] ?- [address] ([facility]) ? ?If you have labs (blood work) drawn today and your tests are completely normal, you will receive your results only by: ?MyChart Message (if you have MyChart) OR ?A paper copy in the mail ?If you have any lab test that is abnormal or we need to change your treatment, we will call you to review the results. ? ? ?Testing/Procedures: ?Your physician has requested that you have an echocardiogram. Echocardiography is a painless test that uses sound waves to create images of your heart. It provides your doctor with information about the size and shape of your heart and how well your heart?s chambers and valves are working. This procedure takes approximately one hour. There are no restrictions for this procedure. ? ?Follow-Up: ?At [facility], you and your health needs are our priority.  As part of our continuing mission to provide you with exceptional heart care, we have created designated Provider Care Teams.  These Care Teams include your primary Cardiologist (physician) and Advanced Practice Providers (APPs -  Physician  Assistants and Nurse Practitioners) who all work together to provide you with the care you need, when you need it. ? ?We recommend signing up for the patient portal called "MyChart".  Sign up information is provided on this After Visit Summary.  MyChart is used to connect with patients for Virtual Visits (Telemedicine).  Patients are able to view lab/test results, encounter notes, upcoming appointments, etc.  Non-urgent messages can be sent to your provider as well.   ?To learn more about what you can do with MyChart, go to [email or web address].   ? ?Your next appointment:   ?3 month(s) ? ?The format for your next appointment:   ?In Person ? ?Provider:   ?Dr. Wyline Mood ? ?Important Information About Sugar ? ? ? ? ? ? ? ? ?Please discuss a sleep study with your primary care provider. Please come back to check your cholesterol , please don't eat prior to the test. We will call you with the echocardiogram results ?

## 2021-06-18 LAB — LIPID PANEL
Chol/HDL Ratio: 3.2 ratio (ref 0.0–4.4)
Cholesterol, Total: 172 mg/dL (ref 100–199)
HDL: 53 mg/dL (ref >39)
LDL Chol Calc (NIH): 106 mg/dL — ABNORMAL HIGH (ref 0–99)
Triglycerides: 68 mg/dL (ref 0–149)
VLDL Cholesterol Cal: 13 mg/dL (ref 5–40)

## 2021-06-25 ENCOUNTER — Ambulatory Visit (HOSPITAL_COMMUNITY): Payer: 59 | Attending: Cardiovascular Disease

## 2021-06-25 DIAGNOSIS — I340 Nonrheumatic mitral (valve) insufficiency: Secondary | ICD-10-CM | POA: Insufficient documentation

## 2021-06-25 DIAGNOSIS — I10 Essential (primary) hypertension: Secondary | ICD-10-CM | POA: Insufficient documentation

## 2021-06-25 DIAGNOSIS — Z8249 Family history of ischemic heart disease and other diseases of the circulatory system: Secondary | ICD-10-CM | POA: Insufficient documentation

## 2021-06-25 DIAGNOSIS — I361 Nonrheumatic tricuspid (valve) insufficiency: Secondary | ICD-10-CM

## 2021-06-25 LAB — ECHOCARDIOGRAM COMPLETE
Area-P 1/2: 3.3 cm2
S' Lateral: 2.9 cm

## 2021-07-11 ENCOUNTER — Encounter (HOSPITAL_COMMUNITY): Payer: Self-pay

## 2021-07-11 ENCOUNTER — Ambulatory Visit (HOSPITAL_COMMUNITY)
Admission: EM | Admit: 2021-07-11 | Discharge: 2021-07-11 | Disposition: A | Discharge status: Home / Self Care | Payer: 59 | Attending: Emergency Medicine | Admitting: Emergency Medicine

## 2021-07-11 DIAGNOSIS — K219 Gastro-esophageal reflux disease without esophagitis: Secondary | ICD-10-CM

## 2021-07-11 MED ORDER — ALUM & MAG HYDROXIDE-SIMETH 200-200-20 MG/5ML PO SUSP
30.0000 mL | Freq: Once | ORAL | Status: AC
  Administered 2021-07-11: 30 mL via ORAL

## 2021-07-11 MED ORDER — ONDANSETRON 4 MG PO TBDP
ORAL_TABLET | ORAL | Status: AC
  Filled 2021-07-11: qty 1

## 2021-07-11 MED ORDER — ONDANSETRON 4 MG PO TBDP: 4.0000 mg | ORAL_TABLET | Freq: Three times a day (TID) | ORAL | 0 refills | Status: DC | PRN

## 2021-07-11 MED ORDER — ALUM & MAG HYDROXIDE-SIMETH 200-200-20 MG/5ML PO SUSP
ORAL | Status: AC
  Filled 2021-07-11: qty 30

## 2021-07-11 MED ORDER — LIDOCAINE VISCOUS HCL 2 % MT SOLN
OROMUCOSAL | Status: AC
  Filled 2021-07-11: qty 15

## 2021-07-11 MED ORDER — ONDANSETRON 4 MG PO TBDP
4.0000 mg | ORAL_TABLET | Freq: Once | ORAL | Status: AC
  Administered 2021-07-11: 4 mg via ORAL

## 2021-07-11 MED ORDER — LIDOCAINE VISCOUS HCL 2 % MT SOLN
15.0000 mL | Freq: Once | OROMUCOSAL | Status: AC
  Administered 2021-07-11: 15 mL via ORAL

## 2021-07-11 MED ORDER — ALUMINUM-MAGNESIUM-SIMETHICONE 200-200-20 MG/5ML PO SUSP: 15.0000 mL | Freq: Three times a day (TID) | ORAL | 0 refills | Status: AC

## 2021-07-11 NOTE — ED Triage Notes (Signed)
Chest pain and nausea on going for over a month. Patient has history of GERD. States chest pain is at the center of the chest under the breast bone.   Patient has been taking reflux meds with no relief. No known injuries or falls.

## 2021-07-11 NOTE — ED Provider Notes (Signed)
[facility]    CSN: [id number] Arrival date & time: 07/11/21  1703      History   Chief Complaint Chief Complaint  Patient presents with   Chest Pain   Nausea    HPI Melanie Romero is a 56 y.o. female.   Patient presents with intermittent centralized abdominal pain radiating to the center of the back for 1 month.  Pain is described as a pressure and is currently rated as severe, 10 out of 10.  Associated nausea without vomiting, abdominal bloating and increased belching..  Feels as if there is a taste of acid in the mouth.has attempted use of 20 mg of Prilosec daily as well as additional over-the-counter medications which has been effective.  Endorses a diet consisting of spicy and greasy foods.  Has upcoming GI consult within the month.    Past Medical History:  Diagnosis Date   Arthritis    Bilateral legs   Headache    Hypertension     There are no problems to display for this patient.   History reviewed. No pertinent surgical history.  OB History   No obstetric history on file.      Home Medications    Prior to Admission medications   Medication Sig Start Date End Date Taking? Authorizing Provider  amLODipine (NORVASC) 5 MG tablet Take 5 mg by mouth daily. 07/19/20  Yes [provider]  gabapentin (NEURONTIN) 300 MG capsule Take 300 mg by mouth daily. 07/19/20  Yes [provider]  hydrochlorothiazide (HYDRODIURIL) 50 MG tablet Take 50 mg by mouth daily. 07/04/20  Yes [provider]  ibuprofen (ADVIL) 800 MG tablet ibuprofen 800 mg tablet  TAKE 1 TABLET 3 TIMES A DAY BY ORAL ROUTE.   Yes [provider]  meloxicam (MOBIC) 15 MG tablet Take 15 mg by mouth daily. 06/25/20  Yes [provider]  tiZANidine (ZANAFLEX) 4 MG tablet Take 4 mg by mouth daily as needed. 05/25/21  Yes [provider]  HYDROcodone-acetaminophen (NORCO) 7.5-325 MG tablet Take 1 tablet by mouth every 4 (four) hours as needed. 04/08/20    [provider]    Family History Family History  Problem Relation Age of Onset   Stroke Mother    Stroke Father    Neuropathy Sister    Stroke Brother     Social History Social History   Tobacco Use   Smoking status: Never   Smokeless tobacco: Never  Vaping Use   Vaping Use: Never used  Substance Use Topics   Alcohol use: Never   Drug use: Never     Allergies   Patient has no known allergies.   Review of Systems Review of Systems  Constitutional: Negative.   Respiratory: Negative.    Cardiovascular:  Negative for chest pain, palpitations and leg swelling.  Gastrointestinal:  Positive for abdominal pain and nausea. Negative for abdominal distention, anal bleeding, blood in stool, constipation, diarrhea, rectal pain and vomiting.  Skin: Negative.   Neurological: Negative.      Physical Exam Triage Vital Signs ED Triage Vitals  Enc Vitals Group     BP 07/11/21 1835 (!) 115/56     Pulse Rate 07/11/21 1835 70     Resp 07/11/21 1835 16     Temp 07/11/21 1835 98.3 F (36.8 C)     Temp Source 07/11/21 1835 Oral     SpO2 07/11/21 1835 100 %     Weight 07/11/21 1836 230 lb (104.3 kg)  Height 07/11/21 1836 5\' 6"  (1.676 m)     Head Circumference --      Peak Flow --      Pain Score 07/11/21 1836 7     Pain Loc --      Pain Edu? --      Excl. in [place]? --    No data found.  Updated Vital Signs BP (!) 115/56 (BP Location: Right Arm)   Pulse 70   Temp 98.3 F (36.8 C) (Oral)   Resp 16   Ht 5\' 6"  (1.676 m)   Wt 230 lb (104.3 kg)   LMP  (LMP Unknown)   SpO2 100%   BMI 37.12 kg/m   Visual Acuity Right Eye Distance:   Left Eye Distance:   Bilateral Distance:    Right Eye Near:   Left Eye Near:    Bilateral Near:     Physical Exam Constitutional:      Appearance: Normal appearance.  Eyes:     Extraocular Movements: Extraocular movements intact.  Pulmonary:     Effort: Pulmonary effort is normal.  Abdominal:     General: Bowel sounds  are normal. There is distension.     Palpations: Abdomen is soft.     Tenderness: There is abdominal tenderness in the epigastric area.  Skin:    General: Skin is warm and dry.  Neurological:     Mental Status: She is alert and oriented to person, place, and time. Mental status is at baseline.  Psychiatric:        Mood and Affect: Mood normal.        Behavior: Behavior normal.      UC Treatments / Results  Labs (all labs ordered are listed, but only abnormal results are displayed) Labs Reviewed - No data to display  EKG   Radiology No results found.  Procedures Procedures (including critical care time)  Medications Ordered in UC Medications - No data to display  Initial Impression / Assessment and Plan / UC Course  I have reviewed the triage vital signs and the nursing notes.  Pertinent labs & imaging results that were available during my care of the patient were reviewed by me and considered in my medical decision making (see chart for details).  GERD without esophagitis  Vital signs are stable, patient is visibly uncomfortable she is in no signs of distress, tenderness noted within the epigastric region however as symptoms have been present for 1 month low suspicion for an acute abdomen or infectious cause, discussed with patient's symptomology is most consistent with a GERD flare, advised patient to increase dosage of Prilosec to 40 mg daily, Maalox given in office and prescribed for outpatient use as well as dosing.  Given in office and prescribed for outpatient management, advised patient to eat a bland diet to allow for gut rest, advised patient to keep upcoming appointment with GI specialist, may follow-up with urgent care or PCP as needed prior to appointment for further evaluation Final Clinical Impressions(s) / UC Diagnoses   Final diagnoses:  None   Discharge Instructions   None    ED Prescriptions   None    PDMP not reviewed this encounter.   Hans Eden, NP 07/11/21 1927

## 2021-07-11 NOTE — Discharge Instructions (Signed)
Increase omeprazole dose to 40 mg (2 tablets) every morning for 7 days, if effective but heartburn is still flared you may take 40 mg in the morning and 20 mg in the evening  You may use Maalox every 6 hours, ideally take 30 minutes before attempting to eat or drink stomach acid and gas production  You may use nausea medicine every 8 hours , wait 30 minutes after use before attempting to eat or drink  Attempt to eat a blander diet, avoid greasy, spicy foods with lots of onions or with a tomato-based as these may flare your symptoms  Please keep upcoming appointment with GI specialist for further evaluation and management, in between appointment unit urgent care or primary doctor as needed for management of

## 2021-07-21 ENCOUNTER — Encounter: Payer: Self-pay | Admitting: Gastroenterology

## 2021-07-21 ENCOUNTER — Ambulatory Visit (INDEPENDENT_AMBULATORY_CARE_PROVIDER_SITE_OTHER): Payer: 59 | Admitting: Gastroenterology

## 2021-07-21 VITALS — BP 120/60 | HR 80 | Ht 65.0 in | Wt 229.1 lb

## 2021-07-21 DIAGNOSIS — K2100 Gastro-esophageal reflux disease with esophagitis, without bleeding: Secondary | ICD-10-CM | POA: Diagnosis not present

## 2021-07-21 DIAGNOSIS — Z1211 Encounter for screening for malignant neoplasm of colon: Secondary | ICD-10-CM

## 2021-07-21 NOTE — Progress Notes (Signed)
HPI : Melanie Romero is a very pleasant 56 year old female with a history of arthritis and hypertension who is referred to [place] by Dr. Laruth Bouchard for colon cancer screening and further evaluation and treatment of GERD.  The patient reports that she has had GERD symptoms most of her life.  Her symptoms consist of epigastric pain, heartburn, acid regurgitation and bloating.  She has been on PPI for many years.  Previously she had been prescribed Nexium which did not work well for her.  She had been taking over-the-counter Prilosec, but her symptoms worsened recently and her dosage was increased.  She presented to [facility] urgent care June 9 with complaints of intermittent abdominal pain radiating to the back for the past month.  She also had symptoms of acid regurgitation, nausea, bloating and excessive belching.   Maalox was given in urgent care and prescribed for outpatient use.  She has been taking omeprazole 40 mg in the morning and 20 mg in the evening, as well as Maalox, usually once a day.  Her symptoms have been well controlled on this regimen.  She is not sure what prompted her symptoms to become acutely worse earlier this month, but this does happen from time to time.  She states that if she was not taking medications, she would have symptoms every single day.  She reports having an upper endoscopy many years ago in [place] and was told that there was esophagitis and a hiatal hernia. She denies dysphagia.  No unintentional weight loss.  She has chronic constipation for which she takes Metamucil daily.  She has regular bowel movements as long as she is taking Metamucil.  She denies problems with lower abdominal pain, diarrhea or blood in her stool.  She has never had a colonoscopy.  She has no family history of GI malignancy.     Past Medical History:  Diagnosis Date   Arthritis    Bilateral legs   Headache    Hypertension      No past surgical history on file. Family History   Problem Relation Age of Onset   Stroke Mother    Stroke Father    Neuropathy Sister    Stroke Brother    Social History   Tobacco Use   Smoking status: Never   Smokeless tobacco: Never  Vaping Use   Vaping Use: Never used  Substance Use Topics   Alcohol use: Never   Drug use: Never   Current Outpatient Medications  Medication Sig Dispense Refill   aluminum-magnesium hydroxide-simethicone (MAALOX) 200-200-20 MG/5ML SUSP Take 15 mLs by mouth 4 (four) times daily -  before meals and at bedtime. 1680 mL 0   amLODipine (NORVASC) 5 MG tablet Take 5 mg by mouth daily.     gabapentin (NEURONTIN) 300 MG capsule Take 300 mg by mouth daily.     hydrochlorothiazide (HYDRODIURIL) 50 MG tablet Take 50 mg by mouth daily.     HYDROcodone-acetaminophen (NORCO) 7.5-325 MG tablet Take 1 tablet by mouth every 4 (four) hours as needed.     ibuprofen (ADVIL) 800 MG tablet ibuprofen 800 mg tablet  TAKE 1 TABLET 3 TIMES A DAY BY ORAL ROUTE.     meloxicam (MOBIC) 15 MG tablet Take 15 mg by mouth daily.     ondansetron (ZOFRAN-ODT) 4 MG disintegrating tablet Take 1 tablet (4 mg total) by mouth every 8 (eight) hours as needed for nausea or vomiting. 20 tablet 0   tiZANidine (ZANAFLEX) 4 MG tablet Take  4 mg by mouth daily as needed.     No current facility-administered medications for this visit.   No Known Allergies   Review of Systems: All systems reviewed and negative except where noted in HPI.    ECHOCARDIOGRAM COMPLETE  Result Date: 06/25/2021    ECHOCARDIOGRAM REPORT   Patient Name:   Melanie Romero Date of Exam: 06/25/2021 Medical Rec #:  [id number]     Height:       66.0 in Accession #:    [id number]    Weight:       227.8 lb Date of Birth:  January 14, 1966     BSA:          2.114 m Patient Age:    55 years      BP:           112/76 mmHg Patient Gender: F             HR:           67 bpm. Exam Location:  [address] Procedure: 2D Echo, Cardiac Doppler, Color Doppler and 3D Echo Indications:     Family history of cardiomyopathy Z82.49  History:        Patient has no prior history of Echocardiogram examinations.                 Risk Factors:Hypertension.  Sonographer:    Thurman Coyer RDCS Referring Phys: [id number] [facility] E BRANCH IMPRESSIONS  1. Left ventricular ejection fraction, by estimation, is 60 to 65%. Left ventricular ejection fraction by 3D volume is 63 %. The left ventricle has normal function. The left ventricle has no regional wall motion abnormalities. Left ventricular diastolic  parameters are consistent with Grade I diastolic dysfunction (impaired relaxation).  2. Right ventricular systolic function is normal. The right ventricular size is normal. There is normal pulmonary artery systolic pressure.  3. The mitral valve is normal in structure. No evidence of mitral valve regurgitation. No evidence of mitral stenosis.  4. Tricuspid valve regurgitation is mild to moderate.  5. The aortic valve is normal in structure. Aortic valve regurgitation is not visualized. No aortic stenosis is present.  6. The inferior vena cava is normal in size with greater than 50% respiratory variability, suggesting right atrial pressure of 3 mmHg. FINDINGS  Left Ventricle: Mitral annulus diastolic velocities are at the lower limit of normal for age. The impaired relaxation pattern may be related to low left atrial pressure. Left ventricular ejection fraction, by estimation, is 60 to 65%. Left ventricular ejection fraction by 3D volume is 63 %. The left ventricle has normal function. The left ventricle has no regional wall motion abnormalities. The left ventricular internal cavity size was normal in size. There is no left ventricular hypertrophy. Left ventricular diastolic parameters are consistent with Grade I diastolic dysfunction (impaired relaxation). Normal left ventricular filling pressure. Right Ventricle: The right ventricular size is normal. No increase in right ventricular wall thickness. Right ventricular  systolic function is normal. There is normal pulmonary artery systolic pressure. The tricuspid regurgitant velocity is 2.44 m/s, and  with an assumed right atrial pressure of 3 mmHg, the estimated right ventricular systolic pressure is 26.8 mmHg. Left Atrium: Left atrial size was normal in size. Right Atrium: Right atrial size was normal in size. Pericardium: There is no evidence of pericardial effusion. Mitral Valve: The mitral valve is normal in structure. No evidence of mitral valve regurgitation. No evidence of mitral valve stenosis. Tricuspid Valve: The tricuspid valve is  normal in structure. Tricuspid valve regurgitation is mild to moderate. No evidence of tricuspid stenosis. Aortic Valve: The aortic valve is normal in structure. Aortic valve regurgitation is not visualized. No aortic stenosis is present. Pulmonic Valve: The pulmonic valve was normal in structure. Pulmonic valve regurgitation is trivial. No evidence of pulmonic stenosis. Aorta: The aortic root is normal in size and structure. Venous: The inferior vena cava is normal in size with greater than 50% respiratory variability, suggesting right atrial pressure of 3 mmHg. IAS/Shunts: No atrial level shunt detected by color flow Doppler.  LEFT VENTRICLE PLAX 2D LVIDd:         4.80 cm         Diastology LVIDs:         2.90 cm         LV e' medial:    7.00 cm/s LV PW:         0.80 cm         LV E/e' medial:  7.3 LV IVS:        1.00 cm         LV e' lateral:   11.00 cm/s                                LV E/e' lateral: 4.6                                 3D Volume EF                                LV 3D EF:    Left                                             ventricul                                             ar                                             ejection                                             fraction                                             by 3D                                             volume is  63 %.                                 3D Volume EF:                                3D EF:        63 %                                LV EDV:       153 ml                                LV ESV:       57 ml                                LV SV:        97 ml RIGHT VENTRICLE RV Basal diam:  3.20 cm RV Mid diam:    2.30 cm RV S prime:     9.90 cm/s TAPSE (M-mode): 1.6 cm LEFT ATRIUM             Index        RIGHT ATRIUM           Index LA diam:        3.30 cm 1.56 cm/m   RA Area:     18.00 cm LA Vol (A2C):   41.1 ml 19.45 ml/m  RA Volume:   44.90 ml  21.24 ml/m LA Vol (A4C):   44.4 ml 21.01 ml/m LA Biplane Vol: 42.6 ml 20.16 ml/m  AORTIC VALVE LVOT Vmax:   94.90 cm/s LVOT Vmean:  61.800 cm/s LVOT VTI:    0.197 m  AORTA Ao Root diam: 3.10 cm Ao Asc diam:  3.40 cm MITRAL VALVE               TRICUSPID VALVE MV Area (PHT): 3.30 cm    TR Peak grad:   23.8 mmHg MV Decel Time: 230 msec    TR Vmax:        244.00 cm/s MV E velocity: 50.80 cm/s MV A velocity: 62.40 cm/s  SHUNTS MV E/A ratio:  0.81        Systemic VTI: 0.20 m Mihai Croitoru MD Electronically signed by Thurmon FairMihai Croitoru MD Signature Date/Time: 06/25/2021/1:15:30 PM    Final     Physical Exam: BP 120/60 (BP Location: Left Arm, Patient Position: Sitting, Cuff Size: Normal)   Pulse 80   Ht 5\' 5"  (1.651 m) Comment: height measured without shoes  Wt 229 lb 2 oz (103.9 kg)   LMP  (LMP Unknown)   BMI 38.13 kg/m  Constitutional: Pleasant,well-developed, African-American female in no acute distress. HEENT: Normocephalic and atraumatic. Conjunctivae are normal. No scleral icterus. Neck supple.  Cardiovascular: Normal rate, regular rhythm.  Pulmonary/chest: Effort normal and breath sounds normal. No wheezing, rales or rhonchi. Abdominal: Soft, nondistended, nontender. Bowel sounds active throughout. There are no masses palpable. No hepatomegaly. Extremities: no edema Neurological: Alert and oriented to person place and time. Skin: Skin is warm and  dry. No rashes noted. Psychiatric: Normal mood and affect. Behavior is normal.  CBC    Component  Value Date/Time   WBC 5.0 03/08/2016 0733   RBC 4.63 03/08/2016 0733   HGB 13.8 03/08/2016 0733   HCT 39.6 03/08/2016 0733   PLT 248 03/08/2016 0733   MCV 85.6 03/08/2016 0733   MCH 29.7 03/08/2016 0733   MCHC 34.7 03/08/2016 0733   RDW 14.5 03/08/2016 0733    CMP     Component Value Date/Time   NA 137 03/08/2016 0733   K 3.7 03/08/2016 0733   CL 100 (L) 03/08/2016 0733   CO2 28 03/08/2016 0733   GLUCOSE 113 (H) 03/08/2016 0733   BUN 12 03/08/2016 0733   CREATININE 0.71 03/08/2016 0733   CALCIUM 8.8 (L) 03/08/2016 0733   PROT 7.9 03/08/2016 0733   ALBUMIN 4.3 03/08/2016 0733   AST 23 03/08/2016 0733   ALT 19 03/08/2016 0733   ALKPHOS 60 03/08/2016 0733   BILITOT 0.8 03/08/2016 0733   GFRNONAA >60 03/08/2016 0733   GFRAA >60 03/08/2016 0733     ASSESSMENT AND PLAN: 56 year old female with chronic typical GERD symptoms, currently controlled with high-dose acid suppression, with reported history of hiatal hernia and reflux esophagitis. We discussed the pathophysiology of GERD and the principles of GERD management to include lifestyle modifications  such as dietary discretion (avoidance of alcohol, tobacco, caffeinated and carbonated beverages, spicy/greasy foods, citrus, peppermint/chocolate), weight loss if applicable, head of bed elevation andconsuming last meal of day within 3 hours of bedtime; pharmacologic options to include PPIs, H2RAs and OTC antacids; and finally surgical or endoscopic fundoplication. Given her persistent symptoms on low-dose PPI, and her reported history of esophagitis, I recommended we repeat an upper endoscopy to assess for complications of GERD and to assess her anatomy for possible fundoplication (although her BMI would likely be prohibitive) The patient is overdue for her initial average risk screening colonoscopy. We will schedule patient for  routine EGD and colonoscopy  GERD with esophagitis - EGD - Continue current medications (omeprazole 40 mg every morning, 20 mg every afternoon, Maalox as needed)  Colon cancer screening - Colonoscopy  The details, risks (including bleeding, perforation, infection, missed lesions, medication reactions and possible hospitalization or surgery if complications occur), benefits, and alternatives to EGD/colonoscopy with possible biopsy and possible polypectomy were discussed with the patient and she consents to proceed.   Abbigayle Toole E. Tomasa Rand, MD [facility]   CC:  Laruth Bouchard, MD

## 2021-07-21 NOTE — Patient Instructions (Signed)
If you are age 55 or older, your body mass index should be between 23-30. Your Body mass index is 38.13 kg/m. If this is out of the aforementioned range listed, please consider follow up with your Primary Care Provider.  If you are age 58 or younger, your body mass index should be between 19-25. Your Body mass index is 38.13 kg/m. If this is out of the aformentioned range listed, please consider follow up with your Primary Care Provider.   You have been scheduled for an endoscopy and colonoscopy. Please follow the written instructions given to you at your visit today. Please pick up your prep supplies at the pharmacy within the next 1-3 days. If you use inhalers (even only as needed), please bring them with you on the day of your procedure.    [facility] providers would like to encourage you to use [facility] to communicate with providers for non-urgent requests or questions.  Due to long hold times on the telephone, sending your provider a message by [facility] may be a faster and more efficient way to get a response.  Please allow 48 business hours for a response.  Please remember that this is for non-urgent requests.   It was a pleasure to see you today!  Thank you for trusting me with your gastrointestinal care!    Scott E.Tomasa Rand , MD

## 2021-09-03 ENCOUNTER — Other Ambulatory Visit: Payer: Self-pay

## 2021-09-03 ENCOUNTER — Telehealth: Payer: Self-pay | Admitting: Gastroenterology

## 2021-09-03 DIAGNOSIS — Z1211 Encounter for screening for malignant neoplasm of colon: Secondary | ICD-10-CM

## 2021-09-03 MED ORDER — NA SULFATE-K SULFATE-MG SULF 17.5-3.13-1.6 GM/177ML PO SOLN: 1.0000 | Freq: Once | ORAL | 0 refills | Status: AC

## 2021-09-03 NOTE — Telephone Encounter (Signed)
Contacted [facility] pharmacy to make sure they has prep. They stated that they has received the prescription for Suprep and that it would be free for the patient. Contacted patient and let her know that Pharmacy stated that they would get prescription ready.

## 2021-09-04 ENCOUNTER — Encounter: Payer: Self-pay | Admitting: Gastroenterology

## 2021-09-05 ENCOUNTER — Ambulatory Visit (AMBULATORY_SURGERY_CENTER): Payer: 59 | Admitting: Gastroenterology

## 2021-09-05 ENCOUNTER — Encounter: Payer: Self-pay | Admitting: Gastroenterology

## 2021-09-05 VITALS — BP 120/70 | HR 67 | Temp 98.0°F | Resp 11 | Ht 65.0 in | Wt 229.0 lb

## 2021-09-05 DIAGNOSIS — Z1211 Encounter for screening for malignant neoplasm of colon: Secondary | ICD-10-CM | POA: Diagnosis not present

## 2021-09-05 DIAGNOSIS — K219 Gastro-esophageal reflux disease without esophagitis: Secondary | ICD-10-CM

## 2021-09-05 MED ORDER — SODIUM CHLORIDE 0.9 % IV SOLN: 500.0000 mL | INTRAVENOUS | Status: DC

## 2021-09-05 NOTE — Op Note (Signed)
[facility] Patient Name: Melanie Romero Procedure Date: 09/05/2021 11:20 AM MRN: [id number] Endoscopist: Lorin Picket E. Tomasa Rand , MD Age: 56 Referring MD:  Date of Birth: May 03, 1965 Gender: Female Account #: [id number] Procedure:                Upper GI endoscopy Indications:              Esophageal reflux symptoms that persist despite                            appropriate therapy Medicines:                Monitored Anesthesia Care Procedure:                Pre-Anesthesia Assessment:                           - Prior to the procedure, a History and Physical                            was performed, and patient medications and                            allergies were reviewed. The patient's tolerance of                            previous anesthesia was also reviewed. The risks                            and benefits of the procedure and the sedation                            options and risks were discussed with the patient.                            All questions were answered, and informed consent                            was obtained. Prior Anticoagulants: The patient has                            taken no previous anticoagulant or antiplatelet                            agents. ASA Grade Assessment: II - A patient with                            mild systemic disease. After reviewing the risks                            and benefits, the patient was deemed in                            satisfactory condition to undergo the procedure.  After obtaining informed consent, the endoscope was                            passed under direct vision. Throughout the                            procedure, the patient's blood pressure, pulse, and                            oxygen saturations were monitored continuously. The                            Endoscope was introduced through the mouth, and                            advanced to the third part of  duodenum. The upper                            GI endoscopy was accomplished without difficulty.                            The patient tolerated the procedure well. Scope In: Scope Out: Findings:                 The examined portions of the nasopharynx,                            oropharynx and larynx were normal.                           The examined esophagus was normal.                           The entire examined stomach was normal.                           The examined duodenum was normal. Complications:            No immediate complications. Estimated Blood Loss:     Estimated blood loss: none. Impression:               - The examined portions of the nasopharynx,                            oropharynx and larynx were normal.                           - Normal esophagus.                           - Normal stomach.                           - Normal examined duodenum.                           - No specimens collected. Recommendation:           -  Patient has a contact number available for                            emergencies. The signs and symptoms of potential                            delayed complications were discussed with the                            patient. Return to normal activities tomorrow.                            Written discharge instructions were provided to the                            patient.                           - Resume previous diet.                           - Continue present medications. Melanie Romero E. Tomasa Rand, MD 09/05/2021 11:50:41 AM This report has been signed electronically.

## 2021-09-05 NOTE — Progress Notes (Signed)
Pt's states no medical or surgical changes since previsit or office visit. 

## 2021-09-05 NOTE — Op Note (Signed)
[place] [facility] Patient Name: Melanie Romero Procedure Date: 09/05/2021 11:17 AM MRN: [id number] Endoscopist: Nicki Reaper E. Candis Schatz , MD Age: 56 Referring MD:  Date of Birth: [birth year] Gender: Female Account #: [id number] Procedure:                Colonoscopy Indications:              Screening for colorectal malignant neoplasm, This                            is the patient's first colonoscopy Medicines:                Monitored Anesthesia Care Procedure:                Pre-Anesthesia Assessment:                           - Prior to the procedure, a History and Physical                            was performed, and patient medications and                            allergies were reviewed. The patient's tolerance of                            previous anesthesia was also reviewed. The risks                            and benefits of the procedure and the sedation                            options and risks were discussed with the patient.                            All questions were answered, and informed consent                            was obtained. Prior Anticoagulants: The patient has                            taken no previous anticoagulant or antiplatelet                            agents. ASA Grade Assessment: II - A patient with                            mild systemic disease. After reviewing the risks                            and benefits, the patient was deemed in                            satisfactory condition to undergo the procedure.  After obtaining informed consent, the colonoscope                            was passed under direct vision. Throughout the                            procedure, the patient's blood pressure, pulse, and                            oxygen saturations were monitored continuously. The                            Olympus CF-HQ190L [id number]) Colonoscope was                            introduced through the  anus and advanced to the the                            terminal ileum, with identification of the                            appendiceal orifice and IC valve. The colonoscopy                            was performed without difficulty. The patient                            tolerated the procedure well. The quality of the                            bowel preparation was good. The terminal ileum,                            ileocecal valve, appendiceal orifice, and rectum                            were photographed. The bowel preparation used was                            SUPREP via split dose instruction. Scope In: 11:29:03 AM Scope Out: 11:43:44 AM Scope Withdrawal Time: 0 hours 9 minutes 50 seconds  Total Procedure Duration: 0 hours 14 minutes 41 seconds  Findings:                 The perianal and digital rectal examinations were                            normal. Pertinent negatives include normal                            sphincter tone and no palpable rectal lesions.                           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                           The exam was otherwise normal throughout the                            examined colon.                           The terminal ileum appeared normal.                           The retroflexed view of the distal rectum and anal                            verge was normal and showed no anal or rectal                            abnormalities. Complications:            No immediate complications. Estimated Blood Loss:     Estimated blood loss: none. Impression:               - Melanosis in the colon.                           - The examined portion of the ileum was normal.                           - The distal rectum and anal verge are normal on                            retroflexion view.                           - No specimens collected. Recommendation:           -  Patient has a contact number available for                            emergencies. The signs and symptoms of potential                            delayed complications were discussed with the                            patient. Return to normal activities tomorrow.                            Written discharge instructions were provided to the                            patient.                           - Resume previous diet.                           -  Continue present medications.                           - Repeat colonoscopy in 10 years for screening                            purposes. Daylyn Christine E. Candis Schatz, MD 09/05/2021 11:55:51 AM This report has been signed electronically.

## 2021-09-05 NOTE — Progress Notes (Signed)
Report to PACU, RN, vss, BBS= Clear.  

## 2021-09-05 NOTE — Progress Notes (Signed)
[facility] History and Physical   Primary Care Physician:  [name], NP (Inactive)   Reason for Procedure:   Colon cancer screening, GERD, history of esophagitis  Plan:    EGD, colonoscopy     HPI: Melanie Romero is a 56 y.o. female undergoing initial average risk screening colonoscopy.  She has no family history of colon cancer.  She has long standing GERD symptoms which are partially controlled with omeprazole 40 mg.  She reports having esophagitis noted on an EGD many years ago in [place]   Past Medical History:  Diagnosis Date   Arthritis    Bilateral legs   Headache    Hypertension     Past Surgical History:  Procedure Laterality Date   NO PAST SURGERIES      Prior to Admission medications   Medication Sig Start Date End Date Taking? Authorizing Provider  amLODipine (NORVASC) 5 MG tablet Take 5 mg by mouth daily. 07/19/20  Yes [provider]  gabapentin (NEURONTIN) 300 MG capsule Take 300 mg by mouth daily. 07/19/20  Yes [provider]  gabapentin (NEURONTIN) 300 MG capsule Take by mouth. 08/28/21 09/27/21 Yes [provider]  hydrochlorothiazide (HYDRODIURIL) 50 MG tablet Take 50 mg by mouth daily. 07/04/20  Yes [provider]  ibuprofen (ADVIL) 200 MG tablet ibuprofen 800 mg tablet  TAKE 1 TABLET 3 TIMES A DAY BY ORAL ROUTE.   Yes [provider]  omeprazole (PRILOSEC) 20 MG capsule Take 20 mg by mouth daily.   Yes [provider]  tiZANidine (ZANAFLEX) 4 MG tablet Take 4 mg by mouth daily as needed. 05/25/21  Yes [provider]  aluminum-magnesium hydroxide-simethicone (MAALOX) 200-200-20 MG/5ML SUSP Take 15 mLs by mouth 4 (four) times daily -  before meals and at bedtime. 07/11/21   White, Elita Boone, NP  fluticasone (FLONASE) 50 MCG/ACT nasal spray Place 1 spray into both nostrils as needed for allergies or rhinitis.    [provider]  HYDROcodone-acetaminophen (NORCO) 7.5-325 MG  tablet Take 1 tablet by mouth every 4 (four) hours as needed. 04/08/20   [provider]  ibuprofen (ADVIL) 800 MG tablet ibuprofen 800 mg tablet  TAKE 1 TABLET 3 TIMES A DAY BY ORAL ROUTE.    [provider]  omega-3 fish oil (MAXEPA) 1000 MG CAPS capsule Take 1 capsule by mouth daily.    [provider]  ondansetron (ZOFRAN-ODT) 4 MG disintegrating tablet Take 1 tablet (4 mg total) by mouth every 8 (eight) hours as needed for nausea or vomiting. 07/11/21   Valinda Hoar, NP  triamcinolone ointment (KENALOG) 0.1 % SMARTSIG:sparingly Topical Twice Daily 05/30/21   [provider]    Current Outpatient Medications  Medication Sig Dispense Refill   amLODipine (NORVASC) 5 MG tablet Take 5 mg by mouth daily.     gabapentin (NEURONTIN) 300 MG capsule Take 300 mg by mouth daily.     gabapentin (NEURONTIN) 300 MG capsule Take by mouth.     hydrochlorothiazide (HYDRODIURIL) 50 MG tablet Take 50 mg by mouth daily.     ibuprofen (ADVIL) 200 MG tablet ibuprofen 800 mg tablet  TAKE 1 TABLET 3 TIMES A DAY BY ORAL ROUTE.     omeprazole (PRILOSEC) 20 MG capsule Take 20 mg by mouth daily.     tiZANidine (ZANAFLEX) 4 MG tablet Take 4 mg by mouth daily as needed.     aluminum-magnesium hydroxide-simethicone (MAALOX) 200-200-20 MG/5ML SUSP Take 15 mLs by mouth 4 (four)  times daily -  before meals and at bedtime. 1680 mL 0   fluticasone (FLONASE) 50 MCG/ACT nasal spray Place 1 spray into both nostrils as needed for allergies or rhinitis.     HYDROcodone-acetaminophen (NORCO) 7.5-325 MG tablet Take 1 tablet by mouth every 4 (four) hours as needed.     ibuprofen (ADVIL) 800 MG tablet ibuprofen 800 mg tablet  TAKE 1 TABLET 3 TIMES A DAY BY ORAL ROUTE.     omega-3 fish oil (MAXEPA) 1000 MG CAPS capsule Take 1 capsule by mouth daily.     ondansetron (ZOFRAN-ODT) 4 MG disintegrating tablet Take 1 tablet (4 mg total) by mouth every 8 (eight) hours as needed for nausea or vomiting. 20  tablet 0   triamcinolone ointment (KENALOG) 0.1 % SMARTSIG:sparingly Topical Twice Daily     Current Facility-Administered Medications  Medication Dose Route Frequency Provider Last Rate Last Admin   0.9 %  sodium chloride infusion  500 mL Intravenous Continuous Jenel Lucks, MD        Allergies as of 09/05/2021 - Review Complete 09/05/2021  Allergen Reaction Noted   Celebrex [celecoxib] Itching 09/05/2021    Family History  Problem Relation Age of Onset   Heart disease Mother    Heart failure Mother    Stroke Father    Neuropathy Sister    Other Sister        cardiomegaly   Stroke Brother    Congestive Heart Failure Brother    Congestive Heart Failure Brother    Arthritis Son    Hypertension Daughter     Social History   Socioeconomic History   Marital status: Single    Spouse name: Not on file   Number of children: 3   Years of education: Not on file   Highest education level: Not on file  Occupational History   Occupation: CNA  Tobacco Use   Smoking status: Never   Smokeless tobacco: Never  Vaping Use   Vaping Use: Never used  Substance and Sexual Activity   Alcohol use: Never   Drug use: Never   Sexual activity: Not on file  Other Topics Concern   Not on file  Social History Narrative   Not on file   Social Determinants of Health   Financial Resource Strain: Not on file  Food Insecurity: Not on file  Transportation Needs: Not on file  Physical Activity: Not on file  Stress: Not on file  Social Connections: Not on file  Intimate Partner Violence: Not on file    Review of Systems:  All other review of systems negative except as mentioned in the HPI.  Physical Exam: Vital signs BP (!) 107/56   Pulse 72   Temp 98 F (36.7 C)   Ht 5\' 5"  (1.651 m)   Wt 229 lb (103.9 kg)   LMP  (LMP Unknown)   SpO2 97%   BMI 38.11 kg/m   General:   Alert,  Well-developed, well-nourished, pleasant and cooperative in NAD Airway:  Mallampati 3 Lungs:   Clear throughout to auscultation.   Heart:  Regular rate and rhythm; no murmurs, clicks, rubs,  or gallops. Abdomen:  Soft, nontender and nondistended. Normal bowel sounds.   Neuro/Psych:  Normal mood and affect. A and O x 3   Meggan Dhaliwal E. , MD [facility]

## 2021-09-05 NOTE — Patient Instructions (Signed)
Normal colonoscopy and endoscopy!!  Next colonoscopy= 10 years  Please continue your normal medications    YOU HAD AN ENDOSCOPIC PROCEDURE TODAY AT THE [facility]:   Refer to the procedure report that was given to you for any specific questions about what was found during the examination.  If the procedure report does not answer your questions, please call your gastroenterologist to clarify.  If you requested that your care partner not be given the details of your procedure findings, then the procedure report has been included in a sealed envelope for you to review at your convenience later.  YOU SHOULD EXPECT: Some feelings of bloating in the abdomen. Passage of more gas than usual.  Walking can help get rid of the air that was put into your GI tract during the procedure and reduce the bloating. If you had a lower endoscopy (such as a colonoscopy or flexible sigmoidoscopy) you may notice spotting of blood in your stool or on the toilet paper. If you underwent a bowel prep for your procedure, you may not have a normal bowel movement for a few days.  Please Note:  You might notice some irritation and congestion in your nose or some drainage.  This is from the oxygen used during your procedure.  There is no need for concern and it should clear up in a day or so.  SYMPTOMS TO REPORT IMMEDIATELY:  Following lower endoscopy (colonoscopy or flexible sigmoidoscopy):  Excessive amounts of blood in the stool  Significant tenderness or worsening of abdominal pains  Swelling of the abdomen that is new, acute  Fever of 100F or higher  Following upper endoscopy (EGD)  Vomiting of blood or coffee ground material  New chest pain or pain under the shoulder blades  Painful or persistently difficult swallowing  New shortness of breath  Fever of 100F or higher  Black, tarry-looking stools  For urgent or emergent issues, a gastroenterologist can be reached at any hour by calling [phone]  [phone]. Do not use MyChart messaging for urgent concerns.    DIET:  We do recommend a small meal at first, but then you may proceed to your regular diet.  Drink plenty of fluids but you should avoid alcoholic beverages for 24 hours.  ACTIVITY:  You should plan to take it easy for the rest of today and you should NOT DRIVE or use heavy machinery until tomorrow (because of the sedation medicines used during the test).    FOLLOW UP: Our staff will call the number listed on your records the next business day following your procedure.  We will call around 7:15- 8:00 am to check on you and address any questions or concerns that you may have regarding the information given to you following your procedure. If we do not reach you, we will leave a message.  If you develop any symptoms (ie: fever, flu-like symptoms, shortness of breath, cough etc.) before then, please call [phone].  If you test positive for Covid 19 in the 2 weeks post procedure, please call and report this information to [place].    If any biopsies were taken you will be contacted by phone or by letter within the next 1-3 weeks.  Please call us at [phone] if you have not heard about the biopsies in 3 weeks.    SIGNATURES/CONFIDENTIALITY: You and/or your care partner have signed paperwork which will be entered into your electronic medical record.  These signatures attest to the fact that that the information  above on your After Visit Summary has been reviewed and is understood.  Full responsibility of the confidentiality of this discharge information lies with you and/or your care-partner.

## 2021-09-08 ENCOUNTER — Telehealth: Payer: Self-pay

## 2021-09-08 NOTE — Telephone Encounter (Signed)
  Follow up Call-     09/05/2021   10:49 AM  Call back number  Post procedure Call Back phone  # [phone]  Permission to leave phone message Yes     Patient questions:  Do you have a fever, pain , or abdominal swelling? No. Pain Score  0 *  Have you tolerated food without any problems? Yes.    Have you been able to return to your normal activities? Yes.    Do you have any questions about your discharge instructions: Diet   No. Medications  No. Follow up visit  No.  Do you have questions or concerns about your Care? No.  Actions: * If pain score is 4 or above: No action needed, pain <4.

## 2021-09-09 NOTE — Progress Notes (Unsigned)
Cardiology Office Note:    Date:  09/10/2021   ID:  [name], DOB 21-Feb-1965, MRN [id number]  PCP:  [name], NP (Inactive)   [facility] Providers Cardiologist:  None     Referring MD: No ref. provider found   No chief complaint on file. HTN, family hx of CAD  History of Present Illness:    Melanie Romero is a 56 y.o. female with a hx of arthritis, migraine, referral for HTN and family hx of CAD  Her brother died of heart failure at 21. He had significant CAD. Her oldest brother had heart failure ; he had diabetes, and hypertension as well as a stroke. Died at 74. Mother died at 78 years old, unknown cause.; noted to have heart disease. Her sister has cardiomyopathy.   She denies angina, dyspnea on exertion, lower extremity edema, PND or orthopnea.  She notes occasional palpitations. This occurs every once in a while. 1-2x per month. She gets left arm tingling. No syncope. Notes loud snoring.  Interim Hx 8/9: Her echo showed normal LV/RV function. No pulmonary HTN. ASCVD 1.6%. She has knee pain BL and considering knee replacement.   Past Medical History:  Diagnosis Date   Arthritis    Bilateral legs   Headache    Hypertension     Past Surgical History:  Procedure Laterality Date   NO PAST SURGERIES      Current Medications: Current Outpatient Medications on File Prior to Visit  Medication Sig Dispense Refill   aluminum-magnesium hydroxide-simethicone (MAALOX) 200-200-20 MG/5ML SUSP Take 15 mLs by mouth 4 (four) times daily -  before meals and at bedtime. 1680 mL 0   amLODipine (NORVASC) 5 MG tablet Take 5 mg by mouth daily.     fluticasone (FLONASE) 50 MCG/ACT nasal spray Place 1 spray into both nostrils as needed for allergies or rhinitis.     gabapentin (NEURONTIN) 300 MG capsule Take 300 mg by mouth daily.     gabapentin (NEURONTIN) 300 MG capsule Take by mouth.     hydrochlorothiazide (HYDRODIURIL) 50 MG tablet Take 50 mg by mouth daily.      HYDROcodone-acetaminophen (NORCO) 7.5-325 MG tablet Take 1 tablet by mouth every 4 (four) hours as needed.     ibuprofen (ADVIL) 200 MG tablet ibuprofen 800 mg tablet  TAKE 1 TABLET 3 TIMES A DAY BY ORAL ROUTE.     ibuprofen (ADVIL) 800 MG tablet ibuprofen 800 mg tablet  TAKE 1 TABLET 3 TIMES A DAY BY ORAL ROUTE.     omega-3 fish oil (MAXEPA) 1000 MG CAPS capsule Take 1 capsule by mouth daily.     omeprazole (PRILOSEC) 20 MG capsule Take 20 mg by mouth daily.     ondansetron (ZOFRAN-ODT) 4 MG disintegrating tablet Take 1 tablet (4 mg total) by mouth every 8 (eight) hours as needed for nausea or vomiting. 20 tablet 0   tiZANidine (ZANAFLEX) 4 MG tablet Take 4 mg by mouth daily as needed.     triamcinolone ointment (KENALOG) 0.1 % SMARTSIG:sparingly Topical Twice Daily     No current facility-administered medications on file prior to visit.      Allergies:   Celebrex [celecoxib]   Social History   Socioeconomic History   Marital status: Single    Spouse name: Not on file   Number of children: 3   Years of education: Not on file   Highest education level: Not on file  Occupational History   Occupation: CNA  Tobacco Use  Smoking status: Never   Smokeless tobacco: Never  Vaping Use   Vaping Use: Never used  Substance and Sexual Activity   Alcohol use: Never   Drug use: Never   Sexual activity: Not on file  Other Topics Concern   Not on file  Social History Narrative   Not on file   Social Determinants of Health   Financial Resource Strain: Not on file  Food Insecurity: Not on file  Transportation Needs: Not on file  Physical Activity: Not on file  Stress: Not on file  Social Connections: Not on file     Family History: The patient's family history includes Arthritis in her son; Congestive Heart Failure in her brother and brother; Heart disease in her mother; Heart failure in her mother; Hypertension in her daughter; Neuropathy in her sister; Other in her sister;  Stroke in her brother and father. Per above  ROS:   Please see the history of present illness.     All other systems reviewed and are negative.  EKGs/Labs/Other Studies Reviewed:    The following studies were reviewed today:   EKG:  EKG is  ordered today.  The ekg ordered today demonstrates   NSR,  inferior axis PVC  Recent Labs: No results found for requested labs within last 365 days.  Recent Lipid Panel    Component Value Date/Time   CHOL 172 06/18/2021 0832   TRIG 68 06/18/2021 0832   HDL 53 06/18/2021 0832   CHOLHDL 3.2 06/18/2021 0832   LDLCALC 106 (H) 06/18/2021 0832     Risk Assessment/Calculations:           Physical Exam:    VS:    Vitals:   09/10/21 1015  BP: 118/62  Pulse: 87  SpO2: 95%     Wt Readings from Last 3 Encounters:  09/05/21 229 lb (103.9 kg)  07/21/21 229 lb 2 oz (103.9 kg)  07/11/21 230 lb (104.3 kg)     GEN:  Well nourished, well developed in no acute distress HEENT: Normal NECK: No JVD; No carotid bruits LYMPHATICS: No lymphadenopathy CARDIAC: RRR, no murmurs, rubs, gallops RESPIRATORY:  Clear to auscultation without rales, wheezing or rhonchi  ABDOMEN: Soft, non-tender, non-distended MUSCULOSKELETAL:  No edema; No deformity  SKIN: Warm and dry NEUROLOGIC:  Alert and oriented x 3 PSYCHIATRIC:  Normal affect   ASSESSMENT:    Family Hx: She has family hx of CHF. Her brother's risk may be related to ischemic heart disease. Unknown if her mother or sister had a familial CM. Her echo was normal. Without diagnosis of familial CM, no plans for further w/u  Cardiac risk assessment:  ASCVD is low. Recommend to continue with lifestyle modification and CVD risk mitigation (yearly A1c, lipid monitoring). Recommended a sleep study prior. We discussed a heart healthy diet and different options for physical activity with knee pain today  HTN: continue norvasc 5 mg daily and HCTz 50 mg daily.    PLAN:    In order of problems listed  above:  No further cardiac testing is indicated at this time         Medication Adjustments/Labs and Tests Ordered: Current medicines are reviewed at length with the patient today.  Concerns regarding medicines are outlined above.  No orders of the defined types were placed in this encounter.  No orders of the defined types were placed in this encounter.   There are no Patient Instructions on file for this visit.   Signed, Carolan Clines  E, MD  09/10/2021 10:11 AM    [facility]

## 2021-09-10 ENCOUNTER — Encounter: Payer: Self-pay | Admitting: Internal Medicine

## 2021-09-10 ENCOUNTER — Ambulatory Visit (INDEPENDENT_AMBULATORY_CARE_PROVIDER_SITE_OTHER): Payer: 59 | Admitting: Internal Medicine

## 2021-09-10 VITALS — BP 118/62 | HR 87 | Ht 65.0 in | Wt 226.2 lb

## 2021-09-10 DIAGNOSIS — Z8249 Family history of ischemic heart disease and other diseases of the circulatory system: Secondary | ICD-10-CM

## 2021-09-10 NOTE — Patient Instructions (Signed)

## 2021-12-24 ENCOUNTER — Other Ambulatory Visit: Payer: Self-pay | Admitting: Physician Assistant

## 2021-12-24 DIAGNOSIS — Z1231 Encounter for screening mammogram for malignant neoplasm of breast: Secondary | ICD-10-CM

## 2022-01-12 ENCOUNTER — Ambulatory Visit: Payer: 59

## 2022-02-26 ENCOUNTER — Ambulatory Visit
Payer: 59 | Attending: Student in an Organized Health Care Education/Training Program | Admitting: Student in an Organized Health Care Education/Training Program

## 2022-02-26 ENCOUNTER — Encounter: Payer: Self-pay | Admitting: Student in an Organized Health Care Education/Training Program

## 2022-02-26 VITALS — BP 109/78 | HR 83 | Temp 97.9°F | Resp 17 | Ht 66.0 in | Wt 215.8 lb

## 2022-02-26 DIAGNOSIS — G894 Chronic pain syndrome: Secondary | ICD-10-CM | POA: Diagnosis not present

## 2022-02-26 DIAGNOSIS — G8929 Other chronic pain: Secondary | ICD-10-CM | POA: Diagnosis present

## 2022-02-26 DIAGNOSIS — M170 Bilateral primary osteoarthritis of knee: Secondary | ICD-10-CM | POA: Diagnosis not present

## 2022-02-26 DIAGNOSIS — M25561 Pain in right knee: Secondary | ICD-10-CM | POA: Diagnosis not present

## 2022-02-26 DIAGNOSIS — M25562 Pain in left knee: Secondary | ICD-10-CM | POA: Diagnosis not present

## 2022-02-26 NOTE — Progress Notes (Signed)
Safety precautions to be maintained throughout the outpatient stay will include: orient to surroundings, keep bed in low position, maintain call bell within reach at all times, provide assistance with transfer out of bed and ambulation.  

## 2022-02-26 NOTE — Patient Instructions (Addendum)
Do not eat or drink for 6 hours Bring a driver Take blood pressure medication with a sip of water the morning of the procedure ____________________________________________________________________________________________  Genicular Nerve Block  What is a genicular nerve block? A genicular nerve block is the injection of a local anesthetic to block the nerves that transmits pain from the knee.  What is the purpose of a facet nerve block? A genicular nerve block is a diagnostic procedure to determine if the pathologic changes (i.e. arthritis, meniscal tears, etc) and inflammation within the knee joint is the source of your knee pain. It also confirms that the knee pain will respond well to the actual treatment procedure. If a genicular nerve block works, it will give you relief for several hours. After that, the pain is expected to return to normal. This test is always performed twice (usually a week or two apart) because two successful tests are required to move onto treatment. If both diagnostic tests are positive, then we schedule a treatment called radiofrequency (RF) ablation. In this procedure, the same nerves are cauterized, which typically leads to pain relief for 4 -18 months. If this process works well for one knee, it can be performed on the other knee if needed.  How is the procedure performed? You will be placed on the procedure table. The injection site is sterilized with either iodine or chlorhexadine. The site to be injected is numbed with a local anesthetic, and a needle is directed to the target area. X-ray guidance is used to ensure proper placement and positioning of the needle. When the needle is properly positioned near the genicular nerve, local anesthetic is injected to numb that nerve. This will be repeated at multiple sites around the knee to block all genicular nerves.  Will the procedure be painful? The injection can be painful and we therefore provide the option of  receiving IV sedation. IV sedation, combined with local anesthetic, can make the injection nearly pain free. It allows you to remain very still during the procedure, which can also make the injection easier, faster, and more successful. If you decide to have IV sedation, you must have a driver to get you home safely afterwards. In addition, you cannot have anything to eat or drink within 8 hours of your appointment (clear liquids are allowed until 3 hours before the procedure). If you take medications for diabetes, these medications may need to be adjusted the morning of the procedure. Your primary care physician can help you with this adjustment.  What are the discharge instructions? If you received IV sedation do not drive or operate machinery for at least 24 hours after the procedure. You may return to work the next day following your procedure. You may resume your normal diet immediately. Do not engage in any strenuous activity for 24 hours. You should, however, engage in moderate activity that typically causes your ususal pain. If the block works, those activities should not be painful for several hours after the injection. Do not take a bath, swim, or use a hot tub for 24 hours (you may take a shower). Call the office if you have any of the following: severe pain afterwards (different than your usual symptoms), redness/swelling/discharge at the injection site(s), fevers/chills, difficulty with bowel or bladder functions.  What are the risks and side effects? The complication rate for this procedure is very low. Whenever a needle enters the skin, bleeding or infection can occur. Some other serious but extremely rare risks include paralysis and death.  You may have an allergic reaction to any of the medications used. If you have a known allergy to any medications, especially local anesthetics, notify our staff before the procedure takes place. You may experience any of the following side effects up to 4 -  6 hours after the procedure: Leg muscle weakness or numbness may occur due to the local anesthetic affecting the nerves that control your legs (this is a temporary affect and it is not paralysis). If you have any leg weakness or numbness, walk only with assistance in order to prevent falls and injury. Your leg strength will return slowly and completely. Dizziness may occur due to a decrease in your blood pressure. If this occurs, remain in a seated or lying position. Gradually sit up, and then stand after at least 10 minutes of sitting. Mild headaches may occur. Drink fluids and take pain medications if needed. If the headaches persist or become severe, call the office. Mild discomfort at the injection site can occur. This typically lasts for a few hours but can persist for a couple days. If this occurs, take anti-inflammatories or pain medications, apply ice to the area the day of the procedure. If it persists, apply moist heat in the day(s) following.  The side effects listed above can be normal. They are not dangerous and will resolve on their own. If, however, you experience any of the following, a complication may have occurred and you should either contact your doctor. If he is not readily available, then you should proceed to the closest urgent care center for evaluation: Severe or progressive pain at the injection site(s) Arm or leg weakness that progressively worsens or persists for longer than 8 hours Severe or progressive redness, swelling, or discharge from the injections site(s) Fevers, chills, nausea, or vomiting Bowel or bladder dysfunction (i.e. inability to urinate or pass stool or difficulty controlling either)  How long does it take for the procedure to work? You should feel relief from your usual pain within the first hour. Again, this is only expected to last for several hours, at the most. Remember, you may be sore in the middle part of your back from the needles, and you must  distinguish this from your usual pain. ____________________________________________________________________________________________

## 2022-02-26 NOTE — Progress Notes (Signed)
Patient: Melanie Romero  Service Category: E/M  Provider: Gillis Santa, MD  DOB: Jun 15, 1965  DOS: 02/26/2022  Referring Provider: Raynelle Bring  MRN: [id number]  Setting: Ambulatory outpatient  PCP: Carmine Savoy, NP (Inactive)  Type: New Patient  Specialty: Interventional Pain Management    Location: Office  Delivery: Face-to-face     Primary Reason(s) for Visit: Encounter for initial evaluation of one or more chronic problems (new to examiner) potentially causing chronic pain, and posing a threat to normal musculoskeletal function. (Level of risk: High) CC: Knee Pain (bilat)  HPI  Melanie Romero is a 57 y.o. year old, female patient, who comes for the first time to our practice referred by Jorge Ny, PA-C for our initial evaluation of her chronic pain. She has Bilateral primary osteoarthritis of knee; Chronic pain of both knees; and Chronic pain syndrome on their problem list. Today she comes in for evaluation of her Knee Pain (bilat)  Pain Assessment: Location: Right, Left Knee Radiating: up to groin bilat and to feet/toes bilat not including bottom of feet Onset: More than a month ago Duration: Chronic pain Quality: Burning Severity: 2 /10 (subjective, self-reported pain score)  Effect on ADL: limits daily activities Timing: Constant Modifying factors: injections BP: 109/78  HR: 83  Onset and Duration: Gradual and Present longer than 3 months Cause of pain: Unknown Severity: NAS-11 at its worse: 10/10, NAS-11 at its best: 10/10, NAS-11 now: 2/10, and NAS-11 on the average: 4/10 Timing: Not influenced by the time of the day, During activity or exercise, After activity or exercise, and After a period of immobility Aggravating Factors: Kneeling, Walking, Walking uphill, Walking downhill, and Working Alleviating Factors: Medications and Resting Associated Problems: Night-time cramps, Fatigue, Nausea, and Pain that wakes patient up Quality of Pain: Aching, Agonizing, and  Burning Previous Examinations or Tests: CT scan, X-rays, and Orthopedic evaluation Previous Treatments: Epidural steroid injections and Narcotic medications  Melanie Romero is being evaluated for possible interventional pain management therapies for the treatment of her chronic pain.   Melanie Romero is a pleasant 57 year old female who presents with a chief complaint of bilateral knee pain related to knee osteoarthritis.  She has been seen and evaluated for this with [facility].  They have tried various treatment options for this including NSAIDs such as ibuprofen, naproxen.  She is currently on Cymbalta at 60 mg and gabapentin 300 mg 3 times a day with limited response.  She has also tried Voltaren gel for bilateral knees.  She works as a Quarry manager.  She has done physical therapy as well.  She has tried intra-articular steroid injections which were more effective than intra-articular viscosupplementation.  She is being referred here for consideration of diagnostic genicular nerve block and possible RFA.  Meds   Current Outpatient Medications:    aluminum-magnesium hydroxide-simethicone (MAALOX) [phone] MG/5ML SUSP, Take 15 mLs by mouth 4 (four) times daily -  before meals and at bedtime., Disp: 1680 mL, Rfl: 0   amLODipine (NORVASC) 5 MG tablet, Take 5 mg by mouth daily., Disp: , Rfl:    DULoxetine (CYMBALTA) 60 MG capsule, Take by mouth., Disp: , Rfl:    fluticasone (FLONASE) 50 MCG/ACT nasal spray, Place 1 spray into both nostrils as needed for allergies or rhinitis., Disp: , Rfl:    gabapentin (NEURONTIN) 300 MG capsule, Take 300 mg by mouth 3 (three) times daily., Disp: , Rfl:    hydrochlorothiazide (HYDRODIURIL) 50 MG tablet, Take 50 mg by mouth daily., Disp: , Rfl:  HYDROcodone-acetaminophen (NORCO) 7.5-325 MG tablet, Take 1 tablet by mouth every 4 (four) hours as needed., Disp: , Rfl:    ibuprofen (ADVIL) 800 MG tablet, ibuprofen 800 mg tablet  TAKE 1 TABLET 3 TIMES A DAY BY ORAL ROUTE.,  Disp: , Rfl:    omega-3 fish oil (MAXEPA) 1000 MG CAPS capsule, Take 1 capsule by mouth daily., Disp: , Rfl:    omeprazole (PRILOSEC) 20 MG capsule, Take 20 mg by mouth daily., Disp: , Rfl:    tiZANidine (ZANAFLEX) 4 MG tablet, Take 4 mg by mouth daily as needed., Disp: , Rfl:    triamcinolone ointment (KENALOG) 0.1 %, SMARTSIG:sparingly Topical Twice Daily, Disp: , Rfl:   Imaging Review  Narrative CLINICAL DATA:  Chronic neck pain with radicular symptoms  EXAM: CERVICAL SPINE - COMPLETE 4+ VIEW  COMPARISON:  None.  FINDINGS: Normal alignment. Early anterior spurring in the mid and lower cervical spine. Disc spaces maintained. Mild degenerative facet disease bilaterally. Early bilateral neural foraminal narrowing from C3-4 through C5-6. No fracture. Prevertebral soft tissues are normal.  IMPRESSION: Mild degenerative facet disease bilaterally with early multilevel neural foraminal narrowing.  No acute bony abnormality.   Electronically Signed By: Rolm Baptise M.D. On: 11/22/2019 22:02  DG Knee Complete 4 Views Left  Narrative CLINICAL DATA:  Severe knee pain  EXAM: LEFT KNEE - COMPLETE 4+ VIEW  COMPARISON:  None.  FINDINGS: No fracture or dislocation of the left knee. There is severe arthrosis and joint space loss, worst in the patellofemoral compartment, moderate in the medial compartment, with relative sparing of the lateral compartment. Small nonspecific knee joint effusion.  IMPRESSION: No fracture or dislocation of the left knee. There is severe arthrosis and joint space loss, worst in the patellofemoral compartment, moderate in the medial compartment, with relative sparing of the lateral compartment. Small nonspecific knee joint effusion.   Electronically Signed By: Eddie Candle M.D. On: 07/07/2018 08:58   Complexity Note: Imaging results reviewed.                         ROS  Cardiovascular: High blood pressure Pulmonary or Respiratory: No  reported pulmonary signs or symptoms such as wheezing and difficulty taking a deep full breath (Asthma), difficulty blowing air out (Emphysema), coughing up mucus (Bronchitis), persistent dry cough, or temporary stoppage of breathing during sleep Neurological: No reported neurological signs or symptoms such as seizures, abnormal skin sensations, urinary and/or fecal incontinence, being born with an abnormal open spine and/or a tethered spinal cord Psychological-Psychiatric: No reported psychological or psychiatric signs or symptoms such as difficulty sleeping, anxiety, depression, delusions or hallucinations (schizophrenial), mood swings (bipolar disorders) or suicidal ideations or attempts Gastrointestinal: Reflux or heatburn Genitourinary: No reported renal or genitourinary signs or symptoms such as difficulty voiding or producing urine, peeing blood, non-functioning kidney, kidney stones, difficulty emptying the bladder, difficulty controlling the flow of urine, or chronic kidney disease Hematological: No reported hematological signs or symptoms such as prolonged bleeding, low or poor functioning platelets, bruising or bleeding easily, hereditary bleeding problems, low energy levels due to low hemoglobin or being anemic Endocrine: No reported endocrine signs or symptoms such as high or low blood sugar, rapid heart rate due to high thyroid levels, obesity or weight gain due to slow thyroid or thyroid disease Rheumatologic: Joint aches and or swelling due to excess weight (Osteoarthritis) Musculoskeletal: Negative for myasthenia gravis, muscular dystrophy, multiple sclerosis or malignant hyperthermia Work History: Working full time  Allergies  Ms. Iglesia is allergic to celebrex [celecoxib].  Laboratory Chemistry Profile   Renal Lab Results  Component Value Date   BUN 12 03/08/2016   CREATININE 0.71 03/08/2016   GFRAA >60 03/08/2016   GFRNONAA >60 03/08/2016   PROTEINUR 30 (A) 03/08/2016      Electrolytes Lab Results  Component Value Date   NA 137 03/08/2016   K 3.7 03/08/2016   CL 100 (L) 03/08/2016   CALCIUM 8.8 (L) 03/08/2016     Hepatic Lab Results  Component Value Date   AST 23 03/08/2016   ALT 19 03/08/2016   ALBUMIN 4.3 03/08/2016   ALKPHOS 60 03/08/2016   LIPASE 25 03/08/2016     ID Lab Results  Component Value Date   PREGTESTUR NEGATIVE 03/08/2016     Bone No results found for: "VD25OH", "VD125OH2TOT", "WI0973ZH2", "DJ2426ST4", "25OHVITD1", "25OHVITD2", "25OHVITD3", "TESTOFREE", "TESTOSTERONE"   Endocrine Lab Results  Component Value Date   GLUCOSE 113 (H) 03/08/2016   GLUCOSEU NEGATIVE 03/08/2016     Neuropathy No results found for: "VITAMINB12", "FOLATE", "HGBA1C", "HIV"   CNS No results found for: "COLORCSF", "APPEARCSF", "RBCCOUNTCSF", "WBCCSF", "POLYSCSF", "LYMPHSCSF", "EOSCSF", "PROTEINCSF", "GLUCCSF", "JCVIRUS", "CSFOLI", "IGGCSF", "LABACHR", "ACETBL"   Inflammation (CRP: Acute  ESR: Chronic) No results found for: "CRP", "ESRSEDRATE", "LATICACIDVEN"   Rheumatology No results found for: "RF", "ANA", "LABURIC", "URICUR", "LYMEIGGIGMAB", "LYMEABIGMQN", "HLAB27"   Coagulation Lab Results  Component Value Date   PLT 248 03/08/2016     Cardiovascular Lab Results  Component Value Date   HGB 13.8 03/08/2016   HCT 39.6 03/08/2016     Screening Lab Results  Component Value Date   PREGTESTUR NEGATIVE 03/08/2016     Cancer No results found for: "CEA", "CA125", "LABCA2"   Allergens No results found for: "ALMOND", "APPLE", "ASPARAGUS", "AVOCADO", "BANANA", "BARLEY", "BASIL", "BAYLEAF", "GREENBEAN", "LIMABEAN", "WHITEBEAN", "BEEFIGE", "REDBEET", "BLUEBERRY", "BROCCOLI", "CABBAGE", "MELON", "CARROT", "CASEIN", "CASHEWNUT", "CAULIFLOWER", "CELERY"     Note: Lab results reviewed.  PFSH  Drug: Ms. Horlacher  reports no history of drug use. Alcohol:  reports no history of alcohol use. Tobacco:  reports that she has never smoked. She has  never used smokeless tobacco. Medical:  has a past medical history of Arthritis, Headache, and Hypertension. Family: family history includes Arthritis in her son; Congestive Heart Failure in her brother and brother; Heart disease in her mother; Heart failure in her mother; Hypertension in her daughter; Neuropathy in her sister; Other in her sister; Stroke in her brother and father.  Past Surgical History:  Procedure Laterality Date   NO PAST SURGERIES     Active Ambulatory Problems    Diagnosis Date Noted   Bilateral primary osteoarthritis of knee 02/26/2022   Chronic pain of both knees 02/26/2022   Chronic pain syndrome 02/26/2022   Resolved Ambulatory Problems    Diagnosis Date Noted   No Resolved Ambulatory Problems   Past Medical History:  Diagnosis Date   Arthritis    Headache    Hypertension    Constitutional Exam  General appearance: Well nourished, well developed, and well hydrated. In no apparent acute distress Vitals:   02/26/22 0913  BP: 109/78  Pulse: 83  Resp: 17  Temp: 97.9 F (36.6 C)  TempSrc: Temporal  SpO2: 98%  Weight: 215 lb 12.8 oz (97.9 kg)  Height: 5\' 6"  (1.676 m)   BMI Assessment: Estimated body mass index is 34.83 kg/m as calculated from the following:   Height as of this encounter: 5\' 6"  (1.676 m).  Weight as of this encounter: 215 lb 12.8 oz (97.9 kg).  BMI interpretation table: BMI level Category Range association with higher incidence of chronic pain  <18 kg/m2 Underweight   18.5-24.9 kg/m2 Ideal body weight   25-29.9 kg/m2 Overweight Increased incidence by 20%  30-34.9 kg/m2 Obese (Class I) Increased incidence by 68%  35-39.9 kg/m2 Severe obesity (Class II) Increased incidence by 136%  >40 kg/m2 Extreme obesity (Class III) Increased incidence by 254%   Patient's current BMI Ideal Body weight  Body mass index is 34.83 kg/m. Ideal body weight: 59.3 kg (130 lb 11.7 oz) Adjusted ideal body weight: 74.7 kg (164 lb 12.2 oz)   BMI  Readings from Last 4 Encounters:  02/26/22 34.83 kg/m  09/10/21 37.64 kg/m  09/05/21 38.11 kg/m  07/21/21 38.13 kg/m   Wt Readings from Last 4 Encounters:  02/26/22 215 lb 12.8 oz (97.9 kg)  09/10/21 226 lb 3.2 oz (102.6 kg)  09/05/21 229 lb (103.9 kg)  07/21/21 229 lb 2 oz (103.9 kg)    Psych/Mental status: Alert, oriented x 3 (person, place, & time)       Eyes: PERLA Respiratory: No evidence of acute respiratory distress  Lower Extremity Exam    Side: Right lower extremity  Side: Left lower extremity  Stability: No instability observed          Stability: No instability observed          Skin & Extremity Inspection: Skin color, temperature, and hair growth are WNL. No peripheral edema or cyanosis. No masses, redness, swelling, asymmetry, or associated skin lesions. No contractures.  Skin & Extremity Inspection: Skin color, temperature, and hair growth are WNL. No peripheral edema or cyanosis. No masses, redness, swelling, asymmetry, or associated skin lesions. No contractures.  Functional ROM: Pain restricted ROM for knee joint          Functional ROM: Pain restricted ROM for knee joint          Muscle Tone/Strength: Functionally intact. No obvious neuro-muscular anomalies detected.  Muscle Tone/Strength: Functionally intact. No obvious neuro-muscular anomalies detected.  Sensory (Neurological): Arthropathic arthralgia        Sensory (Neurological): Arthropathic arthralgia        DTR: Patellar: deferred today Achilles: deferred today Plantar: deferred today  DTR: Patellar: deferred today Achilles: deferred today Plantar: deferred today  Palpation: No palpable anomalies  Palpation: No palpable anomalies    Assessment  Primary Diagnosis & Pertinent Problem List: The primary encounter diagnosis was Bilateral primary osteoarthritis of knee. Diagnoses of Chronic pain of both knees and Chronic pain syndrome were also pertinent to this visit.  Visit Diagnosis (New problems to  examiner): 1. Bilateral primary osteoarthritis of knee   2. Chronic pain of both knees   3. Chronic pain syndrome    Plan of Care (Initial workup plan)   1. Bilateral primary osteoarthritis of knee - GENICULAR NERVE BLOCK; Future  2. Chronic pain of both knees - GENICULAR NERVE BLOCK; Future  3. Chronic pain syndrome - GENICULAR NERVE BLOCK; Future    Procedure Orders         GENICULAR NERVE BLOCK    Provider-requested follow-up: Return in about 2 weeks (around 03/12/2022) for B/L GNB with PO Valium.  Future Appointments  Date Time Provider Department Center  03/09/2022 11:50 AM [facility]    Duration of encounter: .  Total time on encounter, as per AMA guidelines included both the face-to-face and non-face-to-face time personally spent by  the physician and/or other qualified health care professional(s) on the day of the encounter (includes time in activities that require the physician or other qualified health care professional and does not include time in activities normally performed by clinical staff). Physician's time may include the following activities when performed: Preparing to see the patient (e.g., pre-charting review of records, searching for previously ordered imaging, lab work, and nerve conduction tests) Review of prior analgesic pharmacotherapies. Reviewing PMP Interpreting ordered tests (e.g., lab work, imaging, nerve conduction tests) Performing post-procedure evaluations, including interpretation of diagnostic procedures Obtaining and/or reviewing separately obtained history Performing a medically appropriate examination and/or evaluation Counseling and educating the patient/family/caregiver Ordering medications, tests, or procedures Referring and communicating with other health care professionals (when not separately reported) Documenting clinical information in the electronic or other health record Independently interpreting  results (not separately reported) and communicating results to the patient/ family/caregiver Care coordination (not separately reported)  Note by: Edward Jolly, MD Date: 02/26/2022; Time: 10:07 AM

## 2022-03-09 ENCOUNTER — Ambulatory Visit: Payer: 59

## 2022-03-18 ENCOUNTER — Ambulatory Visit
Admission: RE | Admit: 2022-03-18 | Discharge: 2022-03-18 | Disposition: A | Discharge status: Home / Self Care | Payer: 59 | Source: Ambulatory Visit | Attending: Student in an Organized Health Care Education/Training Program | Admitting: Student in an Organized Health Care Education/Training Program

## 2022-03-18 ENCOUNTER — Ambulatory Visit
Payer: 59 | Attending: Student in an Organized Health Care Education/Training Program | Admitting: Student in an Organized Health Care Education/Training Program

## 2022-03-18 ENCOUNTER — Encounter: Payer: Self-pay | Admitting: Student in an Organized Health Care Education/Training Program

## 2022-03-18 DIAGNOSIS — M25562 Pain in left knee: Secondary | ICD-10-CM | POA: Diagnosis present

## 2022-03-18 DIAGNOSIS — G8929 Other chronic pain: Secondary | ICD-10-CM | POA: Diagnosis present

## 2022-03-18 DIAGNOSIS — M25561 Pain in right knee: Secondary | ICD-10-CM | POA: Diagnosis present

## 2022-03-18 DIAGNOSIS — G894 Chronic pain syndrome: Secondary | ICD-10-CM | POA: Insufficient documentation

## 2022-03-18 DIAGNOSIS — M170 Bilateral primary osteoarthritis of knee: Secondary | ICD-10-CM | POA: Diagnosis present

## 2022-03-18 MED ORDER — DEXAMETHASONE SODIUM PHOSPHATE 10 MG/ML IJ SOLN
10.0000 mg | Freq: Once | INTRAMUSCULAR | Status: AC
  Administered 2022-03-18: 10 mg
  Filled 2022-03-18: qty 1

## 2022-03-18 MED ORDER — DIAZEPAM 5 MG PO TABS
5.0000 mg | ORAL_TABLET | ORAL | Status: AC
  Administered 2022-03-18: 5 mg via ORAL

## 2022-03-18 MED ORDER — LIDOCAINE HCL 2 % IJ SOLN
20.0000 mL | Freq: Once | INTRAMUSCULAR | Status: AC
  Administered 2022-03-18: 400 mg
  Filled 2022-03-18: qty 20

## 2022-03-18 MED ORDER — DIAZEPAM 5 MG PO TABS
ORAL_TABLET | ORAL | Status: AC
  Filled 2022-03-18: qty 1

## 2022-03-18 MED ORDER — ROPIVACAINE HCL 2 MG/ML IJ SOLN
9.0000 mL | Freq: Once | INTRAMUSCULAR | Status: AC
  Administered 2022-03-18: 9 mL via PERINEURAL
  Filled 2022-03-18: qty 20

## 2022-03-18 NOTE — Patient Instructions (Signed)

## 2022-03-18 NOTE — Progress Notes (Signed)
PROVIDER NOTE: Interpretation of information contained herein should be left to medically-trained personnel. Specific patient instructions are provided elsewhere under "Patient Instructions" section of medical record. This document was created in part using STT-dictation technology, any transcriptional errors that may result from this process are unintentional.  Patient: Melanie Romero Type: Established DOB: [birth year] MRN: [id number] PCP: Carmine Savoy, NP (Inactive)  Service: Procedure DOS: 03/18/2022 Setting: Ambulatory Location: Ambulatory outpatient facility Delivery: Face-to-face Provider: Gillis Santa, MD Specialty: Interventional Pain Management Specialty designation: 09 Location: Outpatient facility Ref. Prov.: Gillis Santa, MD       Interventional Therapy   Primary Reason for Visit: Interventional Pain Management Treatment. CC: Knee Pain   Procedure:               Type: Genicular Nerves Block (Superolateral, Superomedial, and Inferomedial Genicular Nerves)  #1  Laterality: Bilateral (-50)  Level: Superior and inferior to the knee joint.  Imaging: Fluoroscopic guidance Anesthesia: Local anesthesia (1-2% Lidocaine) Anxiolysis: Oral Valium 5 mg Sedation: Minimal Sedation                       DOS: 03/18/2022  Performed by: Gillis Santa, MD  Purpose: Diagnostic/Therapeutic Indications: Chronic knee pain severe enough to impact quality of life or function. Rationale (medical necessity): procedure needed and proper for the diagnosis and/or treatment of Melanie Romero's medical symptoms and needs. 1. Bilateral primary osteoarthritis of knee   2. Chronic pain of both knees   3. Chronic pain syndrome    NAS-11 Pain score:   Pre-procedure: 8 /10   Post-procedure: 0-No pain/10     Target: For Genicular Nerve block(s), the targets are: the superolateral genicular nerve, located in the lateral distal portion of the femoral shaft as it curves to form the lateral epicondyle, in the  region of the distal femoral metaphysis; the superomedial genicular nerve, located in the medial distal portion of the femoral shaft as it curves to form the medial epicondyle; and the inferomedial genicular nerve, located in the medial, proximal portion of the tibial shaft, as it curves to form the medial epicondyle, in the region of the proximal tibial metaphysis.  Location: Superolateral, Superomedial, and Inferomedial aspects of knee joint.  Region: Lateral, Anterior, and Medial aspects of the knee joint, above and below the knee joint proper. Approach: Percutaneous  Type of procedure: Percutaneous perineural nerve block. The genicular nerve block is a motor-sparing technique that anesthetizes the sensory terminal branches innervating the knee joint, resulting in anesthesia of the anterior compartment of the knee. The distribution of anesthesia of each nerve is mostly in the corresponding quadrant.  Neuroanatomy: The superolateral genicular nerve (SLGN) courses around the femur shaft to pass between the vastus lateralis and the lateral epicondyle. It accompanies the superior lateral genicular artery. The superomedial genicular nerve (SMGN) courses around the femur shaft, following the superior medial genicular artery, to pass between the adductor magnus tendon and the medial epicondyle below the vastus medialis. The inferolateral genicular nerve (ILGN) courses around the tibial lateral epicondyle deep to the lateral collateral ligament, following the inferior lateral genicular artery, superior of the fibula head. The inferomedial genicular nerve (IMGN) courses horizontally below the medial collateral ligament between the tibial medial epicondyle and the insertion of the collateral ligament. It accompanies the inferior medial genicular artery. The recurrent peroneal nerve originates in the inferior popliteal region from the common peroneal nerve and courses horizontally around the fibula to pass just  inferior of the fibula head and travel  superior to the anterolateral tibial epicondyle. It accompanies the recurrent tibial artery.  Position / Prep / Materials:  Position: Supine, Modified Fowler's position with pillows under the targeted knee(s). The patient is placed in a supine position with the knee slightly flexed by placing a pillow in the popliteal fossa. Prep solution: DuraPrep (Iodine Povacrylex [0.7% available iodine] and Isopropyl Alcohol, 74% w/w) Prep Area: Entire knee area, from mid-thigh to mid-shin, lateral, anterior, and medial aspects. Materials:  Tray: Block Needle(s):  Type: Spinal  Gauge (G): 22  Length: 3.5-in  Qty: 3  Pre-op H&P Assessment:  Melanie Romero is a 57 y.o. (year old), female patient, seen today for interventional treatment. She  has a past surgical history that includes No past surgeries. Melanie Romero has a current medication list which includes the following prescription(s): aluminum-magnesium hydroxide-simethicone, amlodipine, duloxetine, fluticasone, gabapentin, hydrochlorothiazide, hydrocodone-acetaminophen, ibuprofen, omega-3 fish oil, omeprazole, tizanidine, and triamcinolone ointment. Her primarily concern today is the Knee Pain  Initial Vital Signs:  Pulse/HCG Rate: 79ECG Heart Rate: 84 Temp: (!) 97.4 F (36.3 C) Resp: 16 BP: (!) 145/97 SpO2: 100 %  BMI: Estimated body mass index is 34.7 kg/m as calculated from the following:   Height as of this encounter: 5' 6"$  (1.676 m).   Weight as of this encounter: 215 lb (97.5 kg).  Risk Assessment: Allergies: Reviewed. She is allergic to celebrex [celecoxib].  Allergy Precautions: None required Coagulopathies: Reviewed. None identified.  Blood-thinner therapy: None at this time Active Infection(s): Reviewed. None identified. Melanie Romero is afebrile  Site Confirmation: Melanie Romero was asked to confirm the procedure and laterality before marking the site Procedure checklist: Completed Consent: Before the  procedure and under the influence of no sedative(s), amnesic(s), or anxiolytics, the patient was informed of the treatment options, risks and possible complications. To fulfill our ethical and legal obligations, as recommended by the American Medical Association's Code of Ethics, I have informed the patient of my clinical impression; the nature and purpose of the treatment or procedure; the risks, benefits, and possible complications of the intervention; the alternatives, including doing nothing; the risk(s) and benefit(s) of the alternative treatment(s) or procedure(s); and the risk(s) and benefit(s) of doing nothing. The patient was provided information about the general risks and possible complications associated with the procedure. These may include, but are not limited to: failure to achieve desired goals, infection, bleeding, organ or nerve damage, allergic reactions, paralysis, and death. In addition, the patient was informed of those risks and complications associated to the procedure, such as failure to decrease pain; infection; bleeding; organ or nerve damage with subsequent damage to sensory, motor, and/or autonomic systems, resulting in permanent pain, numbness, and/or weakness of one or several areas of the body; allergic reactions; (i.e.: anaphylactic reaction); and/or death. Furthermore, the patient was informed of those risks and complications associated with the medications. These include, but are not limited to: allergic reactions (i.e.: anaphylactic or anaphylactoid reaction(s)); adrenal axis suppression; blood sugar elevation that in diabetics may result in ketoacidosis or comma; water retention that in patients with history of congestive heart failure may result in shortness of breath, pulmonary edema, and decompensation with resultant heart failure; weight gain; swelling or edema; medication-induced neural toxicity; particulate matter embolism and blood vessel occlusion with resultant organ,  and/or nervous system infarction; and/or aseptic necrosis of one or more joints. Finally, the patient was informed that Medicine is not an exact science; therefore, there is also the possibility of unforeseen or unpredictable risks and/or possible complications that  may result in a catastrophic outcome. The patient indicated having understood very clearly. We have given the patient no guarantees and we have made no promises. Enough time was given to the patient to ask questions, all of which were answered to the patient's satisfaction. Melanie Romero has indicated that she wanted to continue with the procedure. Attestation: I, the ordering provider, attest that I have discussed with the patient the benefits, risks, side-effects, alternatives, likelihood of achieving goals, and potential problems during recovery for the procedure that I have provided informed consent. Date  Time: 03/18/2022 10:10 AM  Pre-Procedure Preparation:  Monitoring: As per clinic protocol. Respiration, ETCO2, SpO2, BP, heart rate and rhythm monitor placed and checked for adequate function Safety Precautions: Patient was assessed for positional comfort and pressure points before starting the procedure. Time-out: I initiated and conducted the "Time-out" before starting the procedure, as per protocol. The patient was asked to participate by confirming the accuracy of the "Time Out" information. Verification of the correct person, site, and procedure were performed and confirmed by me, the nursing staff, and the patient. "Time-out" conducted as per Joint Commission's Universal Protocol (UP.01.01.01). Time: 1045  Description/Narrative of Procedure:          Rationale (medical necessity): procedure needed and proper for the diagnosis and/or treatment of the patient's medical symptoms and needs. Procedural Technique Safety Precautions: Aspiration looking for blood return was conducted prior to all injections. At no point did we inject any  substances, as a needle was being advanced. No attempts were made at seeking any paresthesias. Safe injection practices and needle disposal techniques used. Medications properly checked for expiration dates. SDV (single dose vial) medications used. Description of the Procedure: Protocol guidelines were followed. The patient was assisted into a comfortable position. The target area was identified and the area prepped in the usual manner. Skin & deeper tissues infiltrated with local anesthetic. Appropriate amount of time allowed to pass for local anesthetics to take effect. The procedure needles were then advanced to the target area. Proper needle placement secured. Negative aspiration confirmed. Solution injected in intermittent fashion, asking for systemic symptoms every 0.5cc of injectate. The needles were then removed and the area cleansed, making sure to leave some of the prepping solution back to take advantage of its long term bactericidal properties.  3 cc of nerve block solution injected at each level bilaterally             Vitals:   03/18/22 1043 03/18/22 1048 03/18/22 1053 03/18/22 1057  BP: (!) 142/86 120/77 (!) 135/93 126/87  Pulse:      Resp: 14 13 14 16  $ Temp:      SpO2: 100% 100% 100% 100%  Weight:      Height:         Start Time: 1045 hrs. End Time: 1057 hrs.  Imaging Guidance (Non-Spinal):          Type of Imaging Technique: Fluoroscopy Guidance (Non-Spinal) Indication(s): Assistance in needle guidance and placement for procedures requiring needle placement in or near specific anatomical locations not easily accessible without such assistance. Exposure Time: Please see nurses notes. Contrast: None used. Fluoroscopic Guidance: I was personally present during the use of fluoroscopy. "Tunnel Vision Technique" used to obtain the best possible view of the target area. Parallax error corrected before commencing the procedure. "Direction-depth-direction" technique used to  introduce the needle under continuous pulsed fluoroscopy. Once target was reached, antero-posterior, oblique, and lateral fluoroscopic projection used confirm needle placement in all  planes. Images permanently stored in EMR. Interpretation: No contrast injected. I personally interpreted the imaging intraoperatively. Adequate needle placement confirmed in multiple planes. Permanent images saved into the patient's record.  Post-operative Assessment:  Post-procedure Vital Signs:  Pulse/HCG Rate: 7980 Temp: (!) 97.4 F (36.3 C) Resp: 16 BP: 126/87 SpO2: 100 %  EBL: None  Complications: No immediate post-treatment complications observed by team, or reported by patient.  Note: The patient tolerated the entire procedure well. A repeat set of vitals were taken after the procedure and the patient was kept under observation following institutional policy, for this type of procedure. Post-procedural neurological assessment was performed, showing return to baseline, prior to discharge. The patient was provided with post-procedure discharge instructions, including a section on how to identify potential problems. Should any problems arise concerning this procedure, the patient was given instructions to immediately contact us, at any time, without hesitation. In any case, we plan to contact the patient by telephone for a follow-up status report regarding this interventional procedure.  Comments:  No additional relevant information.  Plan of Care (POC)  Orders:  Orders Placed This Encounter  Procedures   DG PAIN [facility] C-ARM 1-60 MIN NO REPORT    Intraoperative interpretation by procedural physician at [place].    Standing Status:   Standing    Number of Occurrences:   1    Order Specific Question:   Reason for exam:    Answer:   Assistance in needle guidance and placement for procedures requiring needle placement in or near specific anatomical locations not easily accessible without such  assistance.     Medications ordered for procedure: Meds ordered this encounter  Medications   lidocaine (XYLOCAINE) 2 % (with pres) injection 400 mg   diazepam (VALIUM) tablet 5 mg    Make sure Flumazenil is available in the pyxis when using this medication. If oversedation occurs, administer 0.2 mg IV over 15 sec. If after 45 sec no response, administer 0.2 mg again over 1 min; may repeat at 1 min intervals; not to exceed 4 doses (1 mg)   dexamethasone (DECADRON) injection 10 mg   dexamethasone (DECADRON) injection 10 mg   ropivacaine (PF) 2 mg/mL (0.2%) (NAROPIN) injection 9 mL   Medications administered: We administered lidocaine, diazepam, dexamethasone, dexamethasone, and ropivacaine (PF) 2 mg/mL (0.2%).  See the medical record for exact dosing, route, and time of administration.  Follow-up plan:   Return in about 4 weeks (around 04/15/2022) for PPE F2F .       Recent Visits Date Type Provider Dept  02/26/22 Office Visit Gillis Santa, MD [facility]  Showing recent visits within past 90 days and meeting all other requirements Today's Visits Date Type Provider Dept  03/18/22 Procedure visit Gillis Santa, MD [facility]  Showing today's visits and meeting all other requirements Future Appointments Date Type Provider Dept  04/20/22 Appointment Gillis Santa, MD [facility]  Showing future appointments within next 90 days and meeting all other requirements  Disposition: Discharge home  Discharge (Date  Time): 03/18/2022; 1105 hrs.   Primary Care Physician: Carmine Savoy, NP (Inactive) Location: [facility] Note by: Gillis Santa, MD (TTS technology used. I apologize for any typographical errors that were not detected and corrected.) Date: 03/18/2022; Time: 2:02 PM  Disclaimer:  Medicine is not an Chief Strategy Officer. The only guarantee in medicine is that nothing is guaranteed. It is important to note that the decision  to proceed with this  intervention was based on the information collected from the patient. The Data and conclusions were drawn from the patient's questionnaire, the interview, and the physical examination. Because the information was provided in large part by the patient, it cannot be guaranteed that it has not been purposely or unconsciously manipulated. Every effort has been made to obtain as much relevant data as possible for this evaluation. It is important to note that the conclusions that lead to this procedure are derived in large part from the available data. Always take into account that the treatment will also be dependent on availability of resources and existing treatment guidelines, considered by other Pain Management Practitioners as being common knowledge and practice, at the time of the intervention. For Medico-Legal purposes, it is also important to point out that variation in procedural techniques and pharmacological choices are the acceptable norm. The indications, contraindications, technique, and results of the above procedure should only be interpreted and judged by a Board-Certified Interventional Pain Specialist with extensive familiarity and expertise in the same exact procedure and technique.

## 2022-03-18 NOTE — Progress Notes (Signed)
Safety precautions to be maintained throughout the outpatient stay will include: orient to surroundings, keep bed in low position, maintain call bell within reach at all times, provide assistance with transfer out of bed and ambulation.  

## 2022-03-19 ENCOUNTER — Telehealth: Payer: Self-pay | Admitting: *Deleted

## 2022-03-19 NOTE — Telephone Encounter (Signed)
Attempted to call for post procedure follow-up. Message left. 

## 2022-04-14 ENCOUNTER — Encounter: Payer: Self-pay | Admitting: Student in an Organized Health Care Education/Training Program

## 2022-04-14 ENCOUNTER — Ambulatory Visit
Payer: 59 | Attending: Student in an Organized Health Care Education/Training Program | Admitting: Student in an Organized Health Care Education/Training Program

## 2022-04-14 VITALS — BP 119/77 | HR 95 | Temp 97.4°F | Resp 16 | Ht 66.0 in | Wt 220.0 lb

## 2022-04-14 DIAGNOSIS — M25562 Pain in left knee: Secondary | ICD-10-CM | POA: Diagnosis present

## 2022-04-14 DIAGNOSIS — G894 Chronic pain syndrome: Secondary | ICD-10-CM | POA: Insufficient documentation

## 2022-04-14 DIAGNOSIS — M170 Bilateral primary osteoarthritis of knee: Secondary | ICD-10-CM | POA: Insufficient documentation

## 2022-04-14 DIAGNOSIS — M25561 Pain in right knee: Secondary | ICD-10-CM | POA: Insufficient documentation

## 2022-04-14 DIAGNOSIS — G8929 Other chronic pain: Secondary | ICD-10-CM

## 2022-04-14 NOTE — Progress Notes (Signed)
PROVIDER NOTE: Information contained herein reflects review and annotations entered in association with encounter. Interpretation of such information and data should be left to medically-trained personnel. Information provided to patient can be located elsewhere in the medical record under "Patient Instructions". Document created using STT-dictation technology, any transcriptional errors that may result from process are unintentional.    Patient: Melanie Romero  Service Category: E/M  Provider: Gillis Santa, MD  DOB: [birth year]  DOS: 04/14/2022  Referring Provider: No ref. provider found  MRN: [id number]  Specialty: Interventional Pain Management  PCP: Carmine Savoy, NP (Inactive)  Type: Established Patient  Setting: Ambulatory outpatient    Location: Office  Delivery: Face-to-face     HPI  Ms. Melanie Romero, a 57 y.o. year old female, is here today because of her Bilateral primary osteoarthritis of knee [M17.0]. Ms. Melanie Romero primary complain today is Knee Pain (bilateral)  Pertinent problems: Ms. Melanie Romero has Bilateral primary osteoarthritis of knee; Chronic pain of both knees; and Chronic pain syndrome on their pertinent problem list. Pain Assessment: Severity of Chronic pain is reported as a 3 /10. Location: Knee Right, Left/back of knee to front of shin. Onset: More than a month ago. Quality: Aching. Timing: Intermittent. Modifying factor(s): rest. Vitals:  height is '5\' 6"'$  (1.676 m) and weight is 220 lb (99.8 kg). Her temperature is 97.4 F (36.3 C) (abnormal). Her blood pressure is 119/77 and her pulse is 95. Her respiration is 16 and oxygen saturation is 95%.  BMI: Estimated body mass index is 35.51 kg/m as calculated from the following:   Height as of this encounter: '5\' 6"'$  (1.676 m).   Weight as of this encounter: 220 lb (99.8 kg). Last encounter: 02/26/2022. Last procedure: 03/18/2022.  Reason for encounter: post-procedure evaluation and assessment.    Post-procedure evaluation   Type:  Genicular Nerves Block (Superolateral, Superomedial, and Inferomedial Genicular Nerves)  #1  Laterality: Bilateral (-50)  Level: Superior and inferior to the knee joint.  Imaging: Fluoroscopic guidance Anesthesia: Local anesthesia (1-2% Lidocaine) Anxiolysis: Oral Valium 5 mg Sedation: Minimal Sedation                       DOS: 03/18/2022  Performed by: Gillis Santa, MD  Purpose: Diagnostic/Therapeutic Indications: Chronic knee pain severe enough to impact quality of life or function. Rationale (medical necessity): procedure needed and proper for the diagnosis and/or treatment of Ms. Melanie Romero's medical symptoms and needs. 1. Bilateral primary osteoarthritis of knee   2. Chronic pain of both knees   3. Chronic pain syndrome    NAS-11 Pain score:   Pre-procedure: 8 /10   Post-procedure: 0-No pain/10      Effectiveness:  Initial hour after procedure: 100 %  Subsequent 4-6 hours post-procedure: 100 %  Analgesia past initial 6 hours: 75 % (ongoing)  Ongoing improvement:  Analgesic:  75% Function: Ms. Melanie Romero reports improvement in function ROM: Ms. Melanie Romero reports improvement in ROM    ROS  Constitutional: Denies any fever or chills Gastrointestinal: No reported hemesis, hematochezia, vomiting, or acute GI distress Musculoskeletal:  bilateral knee pain, mild Neurological: No reported episodes of acute onset apraxia, aphasia, dysarthria, agnosia, amnesia, paralysis, loss of coordination, or loss of consciousness  Medication Review  DULoxetine, HYDROcodone-acetaminophen, aluminum-magnesium hydroxide-simethicone, amLODipine, fluticasone, gabapentin, hydrochlorothiazide, ibuprofen, omega-3 fish oil, omeprazole, tiZANidine, and triamcinolone ointment  History Review  Allergy: Ms. Melanie Romero is allergic to celebrex [celecoxib]. Drug: Ms. Melanie Romero  reports no history of drug use. Alcohol:  reports no history of alcohol  use. Tobacco:  reports that she has never smoked. She has never used smokeless  tobacco. Social: Ms. Melanie Romero  reports that she has never smoked. She has never used smokeless tobacco. She reports that she does not drink alcohol and does not use drugs. Medical:  has a past medical history of Arthritis, Headache, and Hypertension. Surgical: Ms. Melanie Romero  has a past surgical history that includes No past surgeries. Family: family history includes Arthritis in her son; Congestive Heart Failure in her brother and brother; Heart disease in her mother; Heart failure in her mother; Hypertension in her daughter; Neuropathy in her sister; Other in her sister; Stroke in her brother and father.  Laboratory Chemistry Profile   Renal Lab Results  Component Value Date   BUN 12 03/08/2016   CREATININE 0.71 03/08/2016   GFRAA >60 03/08/2016   GFRNONAA >60 03/08/2016    Hepatic Lab Results  Component Value Date   AST 23 03/08/2016   ALT 19 03/08/2016   ALBUMIN 4.3 03/08/2016   ALKPHOS 60 03/08/2016   LIPASE 25 03/08/2016    Electrolytes Lab Results  Component Value Date   NA 137 03/08/2016   K 3.7 03/08/2016   CL 100 (L) 03/08/2016   CALCIUM 8.8 (L) 03/08/2016    Bone No results found for: "VD25OH", "VD125OH2TOT", "[id number]", "UK:060616", "25OHVITD1", "25OHVITD2", "25OHVITD3", "TESTOFREE", "TESTOSTERONE"  Inflammation (CRP: Acute Phase) (ESR: Chronic Phase) No results found for: "CRP", "ESRSEDRATE", "LATICACIDVEN"       Note: Above Lab results reviewed.  Recent Imaging Review  DG PAIN CLINIC C-ARM 1-60 MIN NO REPORT Fluoro was used, but no Radiologist interpretation will be provided.  Please refer to "NOTES" tab for provider progress note. Note: Reviewed        Physical Exam  General appearance: Well nourished, well developed, and well hydrated. In no apparent acute distress Mental status: Alert, oriented x 3 (person, place, & time)       Respiratory: No evidence of acute respiratory distress Eyes: PERLA Vitals: BP 119/77   Pulse 95   Temp (!) 97.4 F (36.3 C)    Resp 16   Ht '5\' 6"'$  (1.676 m)   Wt 220 lb (99.8 kg)   LMP  (LMP Unknown)   SpO2 95%   BMI 35.51 kg/m  BMI: Estimated body mass index is 35.51 kg/m as calculated from the following:   Height as of this encounter: '5\' 6"'$  (1.676 m).   Weight as of this encounter: 220 lb (99.8 kg). Ideal: Ideal body weight: 59.3 kg (130 lb 11.7 oz) Adjusted ideal body weight: 75.5 kg (166 lb 7 oz)  Improvement in bilateral knee pain  Assessment   Diagnosis Status  1. Bilateral primary osteoarthritis of knee   2. Chronic pain of both knees   3. Chronic pain syndrome    Controlled Controlled Controlled   Updated Problems: No problems updated.  Plan of Care  75% pain relief of bilateral knee pain after GNB, will continue to monitor sx and follow up for genicular nerve RFA if/when pain returns.   No orders of the defined types were placed in this encounter.  Follow-up plan:   Return if symptoms worsen or fail to improve.      Recent Visits Date Type Provider Dept  03/18/22 Procedure visit Gillis Santa, MD [facility]  02/26/22 Office Visit Gillis Santa, MD [facility]  Showing recent visits within past 90 days and meeting all other requirements Today's Visits Date Type Provider Dept  04/14/22  Office Visit Gillis Santa, MD [facility]  Showing today's visits and meeting all other requirements Future Appointments No visits were found meeting these conditions. Showing future appointments within next 90 days and meeting all other requirements  I discussed the assessment and treatment plan with the patient. The patient was provided an opportunity to ask questions and all were answered. The patient agreed with the plan and demonstrated an understanding of the instructions.  Patient advised to call back or seek an in-person evaluation if the symptoms or condition worsens.  Duration of encounter: 72mnutes.  Total time on encounter, as per AMA guidelines  included both the face-to-face and non-face-to-face time personally spent by the physician and/or other qualified health care professional(s) on the day of the encounter (includes time in activities that require the physician or other qualified health care professional and does not include time in activities normally performed by clinical staff). Physician's time may include the following activities when performed: Preparing to see the patient (e.g., pre-charting review of records, searching for previously ordered imaging, lab work, and nerve conduction tests) Review of prior analgesic pharmacotherapies. Reviewing PMP Interpreting ordered tests (e.g., lab work, imaging, nerve conduction tests) Performing post-procedure evaluations, including interpretation of diagnostic procedures Obtaining and/or reviewing separately obtained history Performing a medically appropriate examination and/or evaluation Counseling and educating the patient/family/caregiver Ordering medications, tests, or procedures Referring and communicating with other health care professionals (when not separately reported) Documenting clinical information in the electronic or other health record Independently interpreting results (not separately reported) and communicating results to the patient/ family/caregiver Care coordination (not separately reported)  Note by: BGillis Santa MD Date: 04/14/2022; Time: 10:42 AM

## 2022-04-14 NOTE — Progress Notes (Signed)
Safety precautions to be maintained throughout the outpatient stay will include: orient to surroundings, keep bed in low position, maintain call bell within reach at all times, provide assistance with transfer out of bed and ambulation.  

## 2022-04-20 ENCOUNTER — Ambulatory Visit: Payer: 59 | Admitting: Student in an Organized Health Care Education/Training Program

## 2022-04-22 ENCOUNTER — Ambulatory Visit
Admission: RE | Admit: 2022-04-22 | Discharge: 2022-04-22 | Disposition: A | Discharge status: Home / Self Care | Payer: 59 | Source: Ambulatory Visit | Attending: Physician Assistant | Admitting: Physician Assistant

## 2022-04-22 DIAGNOSIS — Z1231 Encounter for screening mammogram for malignant neoplasm of breast: Secondary | ICD-10-CM

## 2022-08-27 ENCOUNTER — Ambulatory Visit
Payer: Medicaid Other | Attending: Student in an Organized Health Care Education/Training Program | Admitting: Student in an Organized Health Care Education/Training Program

## 2022-08-27 ENCOUNTER — Encounter: Payer: Self-pay | Admitting: Student in an Organized Health Care Education/Training Program

## 2022-08-27 VITALS — BP 115/78 | HR 81 | Temp 97.8°F | Resp 17 | Ht 66.0 in | Wt 220.0 lb

## 2022-08-27 DIAGNOSIS — G8929 Other chronic pain: Secondary | ICD-10-CM | POA: Diagnosis present

## 2022-08-27 DIAGNOSIS — M25562 Pain in left knee: Secondary | ICD-10-CM | POA: Diagnosis present

## 2022-08-27 DIAGNOSIS — M25561 Pain in right knee: Secondary | ICD-10-CM | POA: Diagnosis present

## 2022-08-27 DIAGNOSIS — G5602 Carpal tunnel syndrome, left upper limb: Secondary | ICD-10-CM | POA: Diagnosis present

## 2022-08-27 DIAGNOSIS — M170 Bilateral primary osteoarthritis of knee: Secondary | ICD-10-CM | POA: Insufficient documentation

## 2022-08-27 NOTE — Progress Notes (Signed)
Safety precautions to be maintained throughout the outpatient stay will include: orient to surroundings, keep bed in low position, maintain call bell within reach at all times, provide assistance with transfer out of bed and ambulation.  

## 2022-08-27 NOTE — Patient Instructions (Signed)
Radiofrequency Ablation Radiofrequency ablation is a procedure that is performed to relieve pain. The procedure is often used for back, neck, or arm pain. Radiofrequency ablation involves the use of a machine that creates radio waves to make heat. During the procedure, the heat is applied to the nerve that carries the pain signal. The heat damages the nerve and interferes with the pain signal. Pain relief usually starts about 2 weeks after the procedure and lasts for 6 months to 1 year. Tell a health care provider about: Any allergies you have. All medicines you are taking, including vitamins, herbs, eye drops, creams, and over-the-counter medicines. Any problems you or family members have had with anesthetic medicines. Any bleeding problems you have. Any surgeries you have had. Any medical conditions you have. Whether you are pregnant or may be pregnant. What are the risks? Generally, this is a safe procedure. However, problems may occur, including: Pain or soreness at the injection site. Allergic reaction to medicines given during the procedure. Bleeding. Infection at the injection site. Damage to nerves or blood vessels. What happens before the procedure? When to stop eating and drinking Follow instructions from your health care provider about what you may eat and drink before your procedure. These may include: 8 hours before the procedure Stop eating most foods. Do not eat meat, fried foods, or fatty foods. Eat only light foods, such as toast or crackers. All liquids are okay except energy drinks and alcohol. 6 hours before the procedure Stop eating. Drink only clear liquids, such as water, clear fruit juice, black coffee, plain tea, and sports drinks. Do not drink energy drinks or alcohol. 2 hours before the procedure Stop drinking all liquids. You may be allowed to take medicine with small sips of water. If you do not follow your health care provider's instructions, your  procedure may be delayed or canceled. Medicines Ask your health care provider about: Changing or stopping your regular medicines. This is especially important if you are taking diabetes medicines or blood thinners. Taking medicines such as aspirin and ibuprofen. These medicines can thin your blood. Do not take these medicines unless your health care provider tells you to take them. Taking over-the-counter medicines, vitamins, herbs, and supplements. General instructions Ask your health care provider what steps will be taken to help prevent infection. These steps may include: Removing hair at the procedure site. Washing skin with a germ-killing soap. Taking antibiotic medicine. If you will be going home right after the procedure, plan to have a responsible adult: Take you home from the hospital or clinic. You will not be allowed to drive. Care for you for the time you are told. What happens during the procedure?  You will be awake during the procedure. You will need to be able to talk with the health care provider during the procedure. An IV will be inserted into one of your veins. You will be given one or more of the following: A medicine to help you relax (sedative). A medicine to numb the area (local anesthetic). Your health care provider will insert a radiofrequency needle into the area to be treated. This is done with the help of fluoroscopy. A wire that carries the radio waves (electrode) will be put through the radiofrequency needle. An electrical pulse will be sent through the electrode to verify the correct nerve that is causing your pain. You will feel a tingling sensation, and you may have muscle twitching. The tissue around the needle tip will be heated by an  electric current that comes from the radiofrequency machine. This will numb the nerves. The needle will be removed. A bandage (dressing) will be put on the insertion area. The procedure may vary among health care providers  and hospitals. What happens after the procedure? Your blood pressure, heart rate, breathing rate, and blood oxygen level will be monitored until you leave the hospital or clinic. Return to your normal activities as told by your health care provider. Ask your health care provider what activities are safe for you. If you were given a sedative during the procedure, it can affect you for several hours. Do not drive or operate machinery until your health care provider says that it is safe. Summary Radiofrequency ablation is a procedure that is performed to relieve pain. The procedure is often used for back, neck, or arm pain. Radiofrequency ablation involves the use of a machine that creates radio waves to make heat. Plan to have a responsible adult take you home from the hospital or clinic. Do not drive or operate machinery until your health care provider says that it is safe. Return to your normal activities as told by your health care provider. Ask your health care provider what activities are safe for you. This information is not intended to replace advice given to you by your health care provider. Make sure you discuss any questions you have with your health care provider. Document Revised: 07/09/2020 Document Reviewed: 07/09/2020 Elsevier Patient Education  2024 [employer].

## 2022-08-27 NOTE — Assessment & Plan Note (Signed)
S/p positive diagnostic GNB (see above for results), with return of pain, discussed genicular nerve RFA

## 2022-08-27 NOTE — Assessment & Plan Note (Signed)
Discussed bracing

## 2022-08-27 NOTE — Progress Notes (Signed)
PROVIDER NOTE: Information contained herein reflects review and annotations entered in association with encounter. Interpretation of such information and data should be left to medically-trained personnel. Information provided to patient can be located elsewhere in the medical record under "Patient Instructions". Document created using STT-dictation technology, any transcriptional errors that may result from process are unintentional.    Patient: Melanie Romero  Service Category: E/M  Provider: Edward Jolly, MD  DOB: 23-Nov-1965  DOS: 08/27/2022  Referring Provider: No ref. provider found  MRN: [id number]  Specialty: Interventional Pain Management  PCP: [name], NP (Inactive)  Type: Established Patient  Setting: Ambulatory outpatient    Location: Office  Delivery: Face-to-face     HPI  Ms. Melanie Romero, a 57 y.o. year old female, is here today because of her Bilateral primary osteoarthritis of knee [M17.0]. Ms. Melanie Romero primary complain today is Knee Pain (bilat)  Pertinent problems: Ms. Melanie Romero has Bilateral primary osteoarthritis of knee; Chronic pain of both knees; Chronic pain syndrome; and Carpal tunnel syndrome of left wrist on their pertinent problem list. Pain Assessment: Severity of Chronic pain is reported as a 10-Worst pain ever/10. Location: Knee Right, Left/up to inside of thighs and down to ankles bilat. Onset: More than a month ago. Quality: Burning, Aching, Sharp. Timing: Constant. Modifying factor(s): rest, reclining. Vitals:  height is 5\' 6"  (1.676 m) and weight is 220 lb (99.8 kg). Her temperature is 97.8 F (36.6 C). Her blood pressure is 115/78 and her pulse is 81. Her respiration is 17 and oxygen saturation is 99%.  BMI: Estimated body mass index is 35.51 kg/m as calculated from the following:   Height as of this encounter: 5\' 6"  (1.676 m).   Weight as of this encounter: 220 lb (99.8 kg). Last encounter: 02/26/2022. Last procedure: 03/18/2022.  Reason for encounter:  patient-requested evaluation.  Patient presents today with increased bilateral knee pain S/P genicular nerve block 03/18/22 which provided her with 75% pain relief and improvement in ADLs for 5 months Given return of pain we discussed genicular nerve RFA, risk and benefits reviewed. Also having sx of left hand carpal tunnel- encouraged patient to try bracing   Post-procedure evaluation   Type: Genicular Nerves Block (Superolateral, Superomedial, and Inferomedial Genicular Nerves)  #1  Laterality: Bilateral (-50)  Level: Superior and inferior to the knee joint.  Imaging: Fluoroscopic guidance Anesthesia: Local anesthesia (1-2% Lidocaine) Anxiolysis: Oral Valium 5 mg Sedation: Minimal Sedation                       DOS: 03/18/2022  Performed by: Edward Jolly, MD  Purpose: Diagnostic/Therapeutic Indications: Chronic knee pain severe enough to impact quality of life or function. Rationale (medical necessity): procedure needed and proper for the diagnosis and/or treatment of Ms. Melanie Romero's medical symptoms and needs. 1. Bilateral primary osteoarthritis of knee   2. Chronic pain of both knees   3. Chronic pain syndrome    NAS-11 Pain score:   Pre-procedure: 8 /10   Post-procedure: 0-No pain/10      Effectiveness:   Ongoing improvement:  Analgesic:  75% Function: Ms. Melanie Romero reports improvement in function ROM: Ms. Melanie Romero reports improvement in ROM    ROS  Constitutional: Denies any fever or chills Gastrointestinal: No reported hemesis, hematochezia, vomiting, or acute GI distress Musculoskeletal:  bilateral knee pain, tingling of left hand fingers Neurological: No reported episodes of acute onset apraxia, aphasia, dysarthria, agnosia, amnesia, paralysis, loss of coordination, or loss of consciousness  Medication Review  DULoxetine,  HYDROcodone-acetaminophen, aluminum-magnesium hydroxide-simethicone, amLODipine, fluticasone, gabapentin, hydrochlorothiazide, ibuprofen, omega-3 fish  oil, omeprazole, tiZANidine, and triamcinolone ointment  History Review  Allergy: Ms. Melanie Romero is allergic to celebrex [celecoxib]. Drug: Ms. Melanie Romero  reports no history of drug use. Alcohol:  reports no history of alcohol use. Tobacco:  reports that she has never smoked. She has never used smokeless tobacco. Social: Ms. Melanie Romero  reports that she has never smoked. She has never used smokeless tobacco. She reports that she does not drink alcohol and does not use drugs. Medical:  has a past medical history of Arthritis, Headache, and Hypertension. Surgical: Ms. Melanie Romero  has a past surgical history that includes No past surgeries. Family: family history includes Arthritis in her son; Congestive Heart Failure in her brother and brother; Heart disease in her mother; Heart failure in her mother; Hypertension in her daughter; Neuropathy in her sister; Other in her sister; Stroke in her brother and father.  Laboratory Chemistry Profile   Renal Lab Results  Component Value Date   BUN 12 03/08/2016   CREATININE 0.71 03/08/2016   GFRAA >60 03/08/2016   GFRNONAA >60 03/08/2016    Hepatic Lab Results  Component Value Date   AST 23 03/08/2016   ALT 19 03/08/2016   ALBUMIN 4.3 03/08/2016   ALKPHOS 60 03/08/2016   LIPASE 25 03/08/2016    Electrolytes Lab Results  Component Value Date   NA 137 03/08/2016   K 3.7 03/08/2016   CL 100 (L) 03/08/2016   CALCIUM 8.8 (L) 03/08/2016    Bone No results found for: "VD25OH", "VD125OH2TOT", "WU9811BJ4", "NW2956OZ3", "25OHVITD1", "25OHVITD2", "25OHVITD3", "TESTOFREE", "TESTOSTERONE"  Inflammation (CRP: Acute Phase) (ESR: Chronic Phase) No results found for: "CRP", "ESRSEDRATE", "LATICACIDVEN"       Note: Above Lab results reviewed.  Recent Imaging Review  MM 3D SCREEN BREAST BILATERAL CLINICAL DATA:  Screening.  EXAM: DIGITAL SCREENING BILATERAL MAMMOGRAM WITH TOMOSYNTHESIS AND CAD  TECHNIQUE: Bilateral screening digital craniocaudal and mediolateral  oblique mammograms were obtained. Bilateral screening digital breast tomosynthesis was performed. The images were evaluated with computer-aided detection.  COMPARISON:  Previous exam(s).  ACR Breast Density Category b: There are scattered areas of fibroglandular density.  FINDINGS: There are no findings suspicious for malignancy.  IMPRESSION: No mammographic evidence of malignancy. A result letter of this screening mammogram will be mailed directly to the patient.  RECOMMENDATION: Screening mammogram in one year. (Code:SM-B-01Y)  BI-RADS CATEGORY  1: Negative.  Electronically Signed   By: Meda Klinefelter M.D.   On: 04/28/2022 07:57 Note: Reviewed        Physical Exam  General appearance: Well nourished, well developed, and well hydrated. In no apparent acute distress Mental status: Alert, oriented x 3 (person, place, & time)       Respiratory: No evidence of acute respiratory distress Eyes: PERLA Vitals: BP 115/78   Pulse 81   Temp 97.8 F (36.6 C)   Resp 17   Ht 5\' 6"  (1.676 m)   Wt 220 lb (99.8 kg)   LMP  (LMP Unknown)   SpO2 99%   BMI 35.51 kg/m  BMI: Estimated body mass index is 35.51 kg/m as calculated from the following:   Height as of this encounter: 5\' 6"  (1.676 m).   Weight as of this encounter: 220 lb (99.8 kg). Ideal: Ideal body weight: 59.3 kg (130 lb 11.7 oz) Adjusted ideal body weight: 75.5 kg (166 lb 7 oz)  Numbness of left hand fingers bilateral knee pain  Assessment   Diagnosis Status  1. Bilateral primary osteoarthritis of knee   2. Chronic pain of both knees   3. Carpal tunnel syndrome of left wrist     Having a Flare-up Having a Flare-up Having a Flare-up   Updated Problems: Problem  Carpal Tunnel Syndrome of Left Wrist  Bilateral Primary Osteoarthritis of Knee    Plan of Care   Bilateral primary osteoarthritis of knee S/p positive diagnostic GNB (see above for results), with return of pain, discussed genicular nerve  RFA  Carpal tunnel syndrome of left wrist Discussed bracing    Follow-up plan:   Return in about 11 days (around 09/07/2022) for B/L Genicular RFA , in clinic IV Versed (block40  mins).      Recent Visits No visits were found meeting these conditions. Showing recent visits within past 90 days and meeting all other requirements Today's Visits Date Type Provider Dept  08/27/22 Office Visit Edward Jolly, MD [facility]  Showing today's visits and meeting all other requirements Future Appointments No visits were found meeting these conditions. Showing future appointments within next 90 days and meeting all other requirements  I discussed the assessment and treatment plan with the patient. The patient was provided an opportunity to ask questions and all were answered. The patient agreed with the plan and demonstrated an understanding of the instructions.  Patient advised to call back or seek an in-person evaluation if the symptoms or condition worsens.  Duration of encounter: .  Total time on encounter, as per AMA guidelines included both the face-to-face and non-face-to-face time personally spent by the physician and/or other qualified health care professional(s) on the day of the encounter (includes time in activities that require the physician or other qualified health care professional and does not include time in activities normally performed by clinical staff). Physician's time may include the following activities when performed: Preparing to see the patient (e.g., pre-charting review of records, searching for previously ordered imaging, lab work, and nerve conduction tests) Review of prior analgesic pharmacotherapies. Reviewing PMP Interpreting ordered tests (e.g., lab work, imaging, nerve conduction tests) Performing post-procedure evaluations, including interpretation of diagnostic procedures Obtaining and/or reviewing separately obtained history Performing a  medically appropriate examination and/or evaluation Counseling and educating the patient/family/caregiver Ordering medications, tests, or procedures Referring and communicating with other health care professionals (when not separately reported) Documenting clinical information in the electronic or other health record Independently interpreting results (not separately reported) and communicating results to the patient/ family/caregiver Care coordination (not separately reported)  Note by: Edward Jolly, MD Date: 08/27/2022; Time: 8:36 AM

## 2022-09-14 ENCOUNTER — Encounter: Payer: Self-pay | Admitting: Student in an Organized Health Care Education/Training Program

## 2022-09-14 ENCOUNTER — Ambulatory Visit
Payer: Medicaid Other | Attending: Student in an Organized Health Care Education/Training Program | Admitting: Student in an Organized Health Care Education/Training Program

## 2022-09-14 ENCOUNTER — Ambulatory Visit
Admission: RE | Admit: 2022-09-14 | Discharge: 2022-09-14 | Disposition: A | Discharge status: Home / Self Care | Payer: Medicaid Other | Source: Ambulatory Visit | Attending: Student in an Organized Health Care Education/Training Program | Admitting: Student in an Organized Health Care Education/Training Program

## 2022-09-14 VITALS — BP 146/92 | HR 72 | Temp 97.9°F | Resp 18 | Ht 66.0 in | Wt 217.0 lb

## 2022-09-14 DIAGNOSIS — G8929 Other chronic pain: Secondary | ICD-10-CM | POA: Diagnosis not present

## 2022-09-14 DIAGNOSIS — M25561 Pain in right knee: Secondary | ICD-10-CM | POA: Insufficient documentation

## 2022-09-14 DIAGNOSIS — M25562 Pain in left knee: Secondary | ICD-10-CM | POA: Diagnosis not present

## 2022-09-14 DIAGNOSIS — M170 Bilateral primary osteoarthritis of knee: Secondary | ICD-10-CM | POA: Insufficient documentation

## 2022-09-14 MED ORDER — MIDAZOLAM HCL 2 MG/2ML IJ SOLN
INTRAMUSCULAR | Status: AC
  Filled 2022-09-14: qty 2

## 2022-09-14 MED ORDER — MIDAZOLAM HCL 2 MG/2ML IJ SOLN
0.5000 mg | Freq: Once | INTRAMUSCULAR | Status: AC
  Administered 2022-09-14: 2 mg via INTRAVENOUS

## 2022-09-14 MED ORDER — LIDOCAINE HCL 2 % IJ SOLN
INTRAMUSCULAR | Status: AC
  Filled 2022-09-14: qty 20

## 2022-09-14 MED ORDER — ROPIVACAINE HCL 2 MG/ML IJ SOLN
9.0000 mL | Freq: Once | INTRAMUSCULAR | Status: AC
  Administered 2022-09-14: 20 mL via PERINEURAL

## 2022-09-14 MED ORDER — DEXAMETHASONE SODIUM PHOSPHATE 10 MG/ML IJ SOLN
INTRAMUSCULAR | Status: AC
  Filled 2022-09-14: qty 1

## 2022-09-14 MED ORDER — LACTATED RINGERS IV SOLN: Freq: Once | INTRAVENOUS | Status: AC

## 2022-09-14 MED ORDER — LIDOCAINE HCL 2 % IJ SOLN
20.0000 mL | Freq: Once | INTRAMUSCULAR | Status: AC
  Administered 2022-09-14: 400 mg

## 2022-09-14 MED ORDER — ROPIVACAINE HCL 2 MG/ML IJ SOLN
INTRAMUSCULAR | Status: AC
  Filled 2022-09-14: qty 20

## 2022-09-14 MED ORDER — DEXAMETHASONE SODIUM PHOSPHATE 10 MG/ML IJ SOLN
20.0000 mg | Freq: Once | INTRAMUSCULAR | Status: AC
  Administered 2022-09-14: 10 mg

## 2022-09-14 NOTE — Progress Notes (Signed)
Safety precautions to be maintained throughout the outpatient stay will include: orient to surroundings, keep bed in low position, maintain call bell within reach at all times, provide assistance with transfer out of bed and ambulation.  

## 2022-09-14 NOTE — Patient Instructions (Signed)
____________________________________________________________________________________________  Post-Procedure Discharge Instructions  Instructions: Apply ice:  Purpose: This will minimize any swelling and discomfort after procedure.  When: Day of procedure, as soon as you get home. How: Fill a plastic sandwich bag with crushed ice. Cover it with a small towel and apply to injection site. How long: (15 min on, 15 min off) Apply for 15 minutes then remove x 15 minutes.  Repeat sequence on day of procedure, until you go to bed. Apply heat:  Purpose: To treat any soreness and discomfort from the procedure. When: Starting the next day after the procedure. How: Apply heat to procedure site starting the day following the procedure. How long: May continue to repeat daily, until discomfort goes away. Food intake: Start with clear liquids (like water) and advance to regular food, as tolerated.  Physical activities: Keep activities to a minimum for the first 8 hours after the procedure. After that, then as tolerated. Driving: If you have received any sedation, be responsible and do not drive. You are not allowed to drive for 24 hours after having sedation. Blood thinner: (Applies only to those taking blood thinners) You may restart your blood thinner 6 hours after your procedure. Insulin: (Applies only to Diabetic patients taking insulin) As soon as you can eat, you may resume your normal dosing schedule. Infection prevention: Keep procedure site clean and dry. Shower daily and clean area with soap and water. Post-procedure Pain Diary: Extremely important that this be done correctly and accurately. Recorded information will be used to determine the next step in treatment. For the purpose of accuracy, follow these rules: Evaluate only the area treated. Do not report or include pain from an untreated area. For the purpose of this evaluation, ignore all other areas of pain, except for the treated  area. After your procedure, avoid taking a long nap and attempting to complete the pain diary after you wake up. Instead, set your alarm clock to go off every hour, on the hour, for the initial 8 hours after the procedure. Document the duration of the numbing medicine, and the relief you are getting from it. Do not go to sleep and attempt to complete it later. It will not be accurate. If you received sedation, it is likely that you were given a medication that may cause amnesia. Because of this, completing the diary at a later time may cause the information to be inaccurate. This information is needed to plan your care. Follow-up appointment: Keep your post-procedure follow-up evaluation appointment after the procedure (usually 2 weeks for most procedures, 6 weeks for radiofrequencies). DO NOT FORGET to bring you pain diary with you.   Expect: (What should I expect to see with my procedure?) From numbing medicine (AKA: Local Anesthetics): Numbness or decrease in pain. You may also experience some weakness, which if present, could last for the duration of the local anesthetic. Onset: Full effect within 15 minutes of injected. Duration: It will depend on the type of local anesthetic used. On the average, 1 to 8 hours.  From steroids (Applies only if steroids were used): Decrease in swelling or inflammation. Once inflammation is improved, relief of the pain will follow. Onset of benefits: Depends on the amount of swelling present. The more swelling, the longer it will take for the benefits to be seen. In some cases, up to 10 days. Duration: Steroids will stay in the system x 2 weeks. Duration of benefits will depend on multiple posibilities including persistent irritating factors. Side-effects: If present, they  may typically last 2 weeks (the duration of the steroids). Frequent: Cramps (if they occur, drink Gatorade and take over-the-counter Magnesium 450-500 mg once to twice a day); water retention with  temporary weight gain; increases in blood sugar; decreased immune system response; increased appetite. Occasional: Facial flushing (red, warm cheeks); mood swings; menstrual changes. Uncommon: Long-term decrease or suppression of natural hormones; bone thinning. (These are more common with higher doses or more frequent use. This is why we prefer that our patients avoid having any injection therapies in other practices.)  Very Rare: Severe mood changes; psychosis; aseptic necrosis. From procedure: Some discomfort is to be expected once the numbing medicine wears off. This should be minimal if ice and heat are applied as instructed.  Call if: (When should I call?) You experience numbness and weakness that gets worse with time, as opposed to wearing off. New onset bowel or bladder incontinence. (Applies only to procedures done in the spine)  Emergency Numbers: Durning business hours (Monday - Thursday, 8:00 AM - 4:00 PM) (Friday, 9:00 AM - 12:00 Noon): [phone] After hours: [phone] NOTE: If you are having a problem and are unable connect with, or to talk to a provider, then go to your nearest urgent care or emergency department. If the problem is serious and urgent, please call 911. ____________________________________________________________________________________________   Radiofrequency Ablation Radiofrequency ablation is a procedure that is performed to relieve pain. The procedure is often used for back, neck, or arm pain. Radiofrequency ablation involves the use of a machine that creates radio waves to make heat. During the procedure, the heat is applied to the nerve that carries the pain signal. The heat damages the nerve and interferes with the pain signal. Pain relief usually starts about 2 weeks after the procedure and lasts for 6 months to 1 year. Tell a health care provider about: Any allergies you have. All medicines you are taking, including vitamins, herbs, eye drops,  creams, and over-the-counter medicines. Any problems you or family members have had with anesthetic medicines. Any bleeding problems you have. Any surgeries you have had. Any medical conditions you have. Whether you are pregnant or may be pregnant. What are the risks? Generally, this is a safe procedure. However, problems may occur, including: Pain or soreness at the injection site. Allergic reaction to medicines given during the procedure. Bleeding. Infection at the injection site. Damage to nerves or blood vessels. What happens before the procedure? When to stop eating and drinking Follow instructions from your health care provider about what you may eat and drink before your procedure. These may include: 8 hours before the procedure Stop eating most foods. Do not eat meat, fried foods, or fatty foods. Eat only light foods, such as toast or crackers. All liquids are okay except energy drinks and alcohol. 6 hours before the procedure Stop eating. Drink only clear liquids, such as water, clear fruit juice, black coffee, plain tea, and sports drinks. Do not drink energy drinks or alcohol. 2 hours before the procedure Stop drinking all liquids. You may be allowed to take medicine with small sips of water. If you do not follow your health care provider's instructions, your procedure may be delayed or canceled. Medicines Ask your health care provider about: Changing or stopping your regular medicines. This is especially important if you are taking diabetes medicines or blood thinners. Taking medicines such as aspirin and ibuprofen. These medicines can thin your blood. Do not take these medicines unless your health care provider tells  you to take them. Taking over-the-counter medicines, vitamins, herbs, and supplements. General instructions Ask your health care provider what steps will be taken to help prevent infection. These steps may include: Removing hair at the procedure  site. Washing skin with a germ-killing soap. Taking antibiotic medicine. If you will be going home right after the procedure, plan to have a responsible adult: Take you home from the hospital or clinic. You will not be allowed to drive. Care for you for the time you are told. What happens during the procedure?  You will be awake during the procedure. You will need to be able to talk with the health care provider during the procedure. An IV will be inserted into one of your veins. You will be given one or more of the following: A medicine to help you relax (sedative). A medicine to numb the area (local anesthetic). Your health care provider will insert a radiofrequency needle into the area to be treated. This is done with the help of fluoroscopy. A wire that carries the radio waves (electrode) will be put through the radiofrequency needle. An electrical pulse will be sent through the electrode to verify the correct nerve that is causing your pain. You will feel a tingling sensation, and you may have muscle twitching. The tissue around the needle tip will be heated by an electric current that comes from the radiofrequency machine. This will numb the nerves. The needle will be removed. A bandage (dressing) will be put on the insertion area. The procedure may vary among health care providers and hospitals. What happens after the procedure? Your blood pressure, heart rate, breathing rate, and blood oxygen level will be monitored until you leave the hospital or clinic. Return to your normal activities as told by your health care provider. Ask your health care provider what activities are safe for you. If you were given a sedative during the procedure, it can affect you for several hours. Do not drive or operate machinery until your health care provider says that it is safe. Summary Radiofrequency ablation is a procedure that is performed to relieve pain. The procedure is often used for back, neck,  or arm pain. Radiofrequency ablation involves the use of a machine that creates radio waves to make heat. Plan to have a responsible adult take you home from the hospital or clinic. Do not drive or operate machinery until your health care provider says that it is safe. Return to your normal activities as told by your health care provider. Ask your health care provider what activities are safe for you. This information is not intended to replace advice given to you by your health care provider. Make sure you discuss any questions you have with your health care provider. Document Revised: 07/09/2020 Document Reviewed: 07/09/2020 Elsevier Patient Education  2024 [employer].

## 2022-09-14 NOTE — Progress Notes (Signed)
PROVIDER NOTE: Interpretation of information contained herein should be left to medically-trained personnel. Specific patient instructions are provided elsewhere under "Patient Instructions" section of medical record. This document was created in part using STT-dictation technology, any transcriptional errors that may result from this process are unintentional.  Patient: Melanie Romero Type: Established DOB: 1965/05/30 MRN: [id number] PCP: [name], NP (Inactive)  Service: Procedure DOS: 09/14/2022 Setting: Ambulatory Location: Ambulatory outpatient facility Delivery: Face-to-face Provider: Edward Jolly, MD Specialty: Interventional Pain Management Specialty designation: 09 Location: Outpatient facility Ref. Prov.: No ref. provider found       Interventional Therapy   Primary Reason for Visit: Interventional Pain Management Treatment. CC: Knee Pain (bilat)    Procedure:          Anesthesia, Analgesia, Anxiolysis:  Type: Therapeutic Superolateral, Superomedial, and Inferomedial, Genicular Nerve Radiofrequency Ablation (destruction).   #1  Region: Lateral, Anterior, and Medial aspects of the knee joint, above and below the knee joint proper. Level: Superior and inferior to the knee joint. Laterality: Bilateral  Anesthesia: Local (1-2% Lidocaine)  Anxiolysis: None  Sedation: Minimal 2 mg IV Versed  Guidance: Fluoroscopy           Position: Supine   Indications: 1. Bilateral primary osteoarthritis of knee   2. Chronic pain of both knees    [name] has been dealing with the above chronic pain for longer than three months and has either failed to respond, was unable to tolerate, or simply did not get enough benefit from other more conservative therapies including, but not limited to: 1. Over-the-counter medications 2. Anti-inflammatory medications 3. Muscle relaxants 4. Membrane stabilizers 5. Opioids 6. Physical therapy and/or chiropractic manipulation 7. Modalities (Heat,  ice, etc.) 8. Invasive techniques such as nerve blocks. Melanie Romero has attained more than 50% relief of the pain from a series of diagnostic injections conducted in separate occasions.  Pain Score: Pre-procedure: 10-Worst pain ever/10 Post-procedure: 0-No pain/10    H&P (Pre-op Assessment):  Melanie Romero is a 57 y.o. (year old), female patient, seen today for interventional treatment. She  has a past surgical history that includes No past surgeries. Ms. Blair has a current medication list which includes the following prescription(s): aluminum-magnesium hydroxide-simethicone, amlodipine, duloxetine, fluticasone, gabapentin, hydrochlorothiazide, hydrocodone-acetaminophen, ibuprofen, omega-3 fish oil, omeprazole, tizanidine, and triamcinolone ointment. Her primarily concern today is the Knee Pain (bilat)  Initial Vital Signs:  Pulse/HCG Rate: 90ECG Heart Rate: 90 Temp: (!) 96.6 F (35.9 C) Resp: 16 BP: 134/85 SpO2: 98 %  BMI: Estimated body mass index is 35.02 kg/m as calculated from the following:   Height as of this encounter: 5\' 6"  (1.676 m).   Weight as of this encounter: 217 lb (98.4 kg).  Risk Assessment: Allergies: Reviewed. She is allergic to celebrex [celecoxib].  Allergy Precautions: None required Coagulopathies: Reviewed. None identified.  Blood-thinner therapy: None at this time Active Infection(s): Reviewed. None identified. Melanie Romero is afebrile  Site Confirmation: Melanie Romero was asked to confirm the procedure and laterality before marking the site Procedure checklist: Completed Consent: Before the procedure and under the influence of no sedative(s), amnesic(s), or anxiolytics, the patient was informed of the treatment options, risks and possible complications. To fulfill our ethical and legal obligations, as recommended by the American Medical Association's Code of Ethics, I have informed the patient of my clinical impression; the nature and purpose of the treatment or  procedure; the risks, benefits, and possible complications of the intervention; the alternatives, including doing nothing; the risk(s) and benefit(s) of  the alternative treatment(s) or procedure(s); and the risk(s) and benefit(s) of doing nothing. The patient was provided information about the general risks and possible complications associated with the procedure. These may include, but are not limited to: failure to achieve desired goals, infection, bleeding, organ or nerve damage, allergic reactions, paralysis, and death. In addition, the patient was informed of those risks and complications associated to the procedure, such as failure to decrease pain; infection; bleeding; organ or nerve damage with subsequent damage to sensory, motor, and/or autonomic systems, resulting in permanent pain, numbness, and/or weakness of one or several areas of the body; allergic reactions; (i.e.: anaphylactic reaction); and/or death. Furthermore, the patient was informed of those risks and complications associated with the medications. These include, but are not limited to: allergic reactions (i.e.: anaphylactic or anaphylactoid reaction(s)); adrenal axis suppression; blood sugar elevation that in diabetics may result in ketoacidosis or comma; water retention that in patients with history of congestive heart failure may result in shortness of breath, pulmonary edema, and decompensation with resultant heart failure; weight gain; swelling or edema; medication-induced neural toxicity; particulate matter embolism and blood vessel occlusion with resultant organ, and/or nervous system infarction; and/or aseptic necrosis of one or more joints. Finally, the patient was informed that Medicine is not an exact science; therefore, there is also the possibility of unforeseen or unpredictable risks and/or possible complications that may result in a catastrophic outcome. The patient indicated having understood very clearly. We have given the  patient no guarantees and we have made no promises. Enough time was given to the patient to ask questions, all of which were answered to the patient's satisfaction. [name] has indicated that she wanted to continue with the procedure. Attestation: I, the ordering provider, attest that I have discussed with the patient the benefits, risks, side-effects, alternatives, likelihood of achieving goals, and potential problems during recovery for the procedure that I have provided informed consent. Date  Time: 09/14/2022  7:57 AM  Pre-Procedure Preparation:  Monitoring: As per clinic protocol. Respiration, ETCO2, SpO2, BP, heart rate and rhythm monitor placed and checked for adequate function Safety Precautions: Patient was assessed for positional comfort and pressure points before starting the procedure. Time-out: I initiated and conducted the "Time-out" before starting the procedure, as per protocol. The patient was asked to participate by confirming the accuracy of the "Time Out" information. Verification of the correct person, site, and procedure were performed and confirmed by me, the nursing staff, and the patient. "Time-out" conducted as per Joint Commission's Universal Protocol (UP.01.01.01). Time: 0831 Start Time: 0831 hrs.  Description of Procedure:          Target Area: For Genicular Nerve radiofrequency ablation (destruction), the targets are: the superolateral genicular nerve, located in the lateral distal portion of the femoral shaft as it curves to form the lateral epicondyle, in the region of the distal femoral metaphysis; the superomedial genicular nerve, located in the medial distal portion of the femoral shaft as it curves to form the medial epicondyle; and the inferomedial genicular nerve, located in the medial, proximal portion of the tibial shaft, as it curves to form the medial epicondyle, in the region of the proximal tibial metaphysis. Approach: Anterior, ipsilateral approach. Area  Prepped: Entire knee area, from mid-thigh to mid-shin, lateral, anterior, and medial aspects. DuraPrep (Iodine Povacrylex [0.7% available iodine] and Isopropyl Alcohol, 74% w/w) Safety Precautions: Aspiration looking for blood return was conducted prior to all injections. At no point did we inject any substances, as a  needle was being advanced. No attempts were made at seeking any paresthesias. Safe injection practices and needle disposal techniques used. Medications properly checked for expiration dates. SDV (single dose vial) medications used. Description of the Procedure: Protocol guidelines were followed. The patient was placed in position over the procedure table. The target area was identified and the area prepped in the usual manner. The skin and muscle were infiltrated with local anesthetic. Appropriate amount of time allowed to pass for local anesthetics to take effect. Radiofrequency needles were introduced to the target area using fluoroscopic guidance. Using the NeuroTherm NT1100 Radiofrequency Generator, sensory stimulation using 50 Hz was used to locate & identify the nerve, making sure that the needle was positioned such that there was no sensory stimulation below 0.3 V or above 0.7 V. Stimulation using 2 Hz was used to evaluate the motor component. Care was taken not to lesion any nerves that demonstrated motor stimulation of the lower extremities at an output of less than 2.5 times that of the sensory threshold, or a maximum of 2.0 V. Once satisfactory placement of the needles was achieved, the numbing solution was slowly injected after negative aspiration. After waiting for at least 2 minutes, the ablation was performed at 80 degrees C for 60 seconds, using regular Radiofrequency settings. Once the procedure was completed, the needles were then removed and the area cleansed, making sure to leave some of the prepping solution back to take advantage of its long term bactericidal  properties. Intra-operative Compliance: Compliant      Vitals:   09/14/22 0908 09/14/22 0913 09/14/22 0916 09/14/22 0923  BP: (!) 107/52 (!) 84/48 99/75 (!) 146/92  Pulse:    72  Resp: 18 20 16 18   Temp:    97.9 F (36.6 C)  TempSrc:    Temporal  SpO2: 100% 100% 100% 100%  Weight:      Height:        Start Time: 0831 hrs. End Time: 0915 hrs. Materials & Medications:  Needle(s) Type: Teflon-coated, curved tip, Radiofrequency needle(s) Gauge: 22G Length: 10cm Medication(s): Please see orders for medications and dosing details.  10 cc solution made of 8 cc of 0.2% ropivacaine, 2 cc of Decadron 10 mg/cc. 2 cc injected at each level for the right and left genicular nerves after sensorimotor testing, prior to lesioning.   Imaging Guidance (Non-Spinal):          Type of Imaging Technique: Fluoroscopy Guidance (Non-Spinal) Indication(s): Assistance in needle guidance and placement for procedures requiring needle placement in or near specific anatomical locations not easily accessible without such assistance. Exposure Time: Please see nurses notes. Contrast: Before injecting any contrast, we confirmed that the patient did not have an allergy to iodine, shellfish, or radiological contrast. Once satisfactory needle placement was completed at the desired level, radiological contrast was injected. Contrast injected under live fluoroscopy. No contrast complications. See chart for type and volume of contrast used. Fluoroscopic Guidance: I was personally present during the use of fluoroscopy. "Tunnel Vision Technique" used to obtain the best possible view of the target area. Parallax error corrected before commencing the procedure. "Direction-depth-direction" technique used to introduce the needle under continuous pulsed fluoroscopy. Once target was reached, antero-posterior, oblique, and lateral fluoroscopic projection used confirm needle placement in all planes. Images permanently stored in  EMR. Interpretation: I personally interpreted the imaging intraoperatively. Adequate needle placement confirmed in multiple planes. Appropriate spread of contrast into desired area was observed. No evidence of afferent or efferent intravascular uptake. Permanent  images saved into the patient's record.  Antibiotic Prophylaxis:   Anti-infectives (From admission, onward)    None      Indication(s): None identified  Post-operative Assessment:  Post-procedure Vital Signs:  Pulse/HCG Rate: 7282 Temp: 97.9 F (36.6 C) Resp: 18 BP: (!) 146/92 SpO2: 100 %  EBL: None  Complications: No immediate post-treatment complications observed by team, or reported by patient.  Note: The patient tolerated the entire procedure well. A repeat set of vitals were taken after the procedure and the patient was kept under observation following institutional policy, for this type of procedure. Post-procedural neurological assessment was performed, showing return to baseline, prior to discharge. The patient was provided with post-procedure discharge instructions, including a section on how to identify potential problems. Should any problems arise concerning this procedure, the patient was given instructions to immediately contact us, at any time, without hesitation. In any case, we plan to contact the patient by telephone for a follow-up status report regarding this interventional procedure.  Comments:  No additional relevant information.  Plan of Care (POC)  Orders:  Orders Placed This Encounter  Procedures   DG PAIN CLINIC C-ARM 1-60 MIN NO REPORT    Intraoperative interpretation by procedural physician at [facility].    Standing Status:   Standing    Number of Occurrences:   1    Order Specific Question:   Reason for exam:    Answer:   Assistance in needle guidance and placement for procedures requiring needle placement in or near specific anatomical locations not easily accessible without  such assistance.    Medications ordered for procedure: Meds ordered this encounter  Medications   lidocaine (XYLOCAINE) 2 % (with pres) injection 400 mg   lactated ringers infusion   midazolam (VERSED) injection 0.5-2 mg    Make sure Flumazenil is available in the pyxis when using this medication. If oversedation occurs, administer 0.2 mg IV over 15 sec. If after 45 sec no response, administer 0.2 mg again over 1 min; may repeat at 1 min intervals; not to exceed 4 doses (1 mg)   dexamethasone (DECADRON) injection 20 mg   ropivacaine (PF) 2 mg/mL (0.2%) (NAROPIN) injection 9 mL   ropivacaine (PF) 2 mg/mL (0.2%) (NAROPIN) injection 9 mL   Medications administered: We administered lidocaine, lactated ringers, midazolam, dexamethasone, ropivacaine (PF) 2 mg/mL (0.2%), and ropivacaine (PF) 2 mg/mL (0.2%).  See the medical record for exact dosing, route, and time of administration.  Follow-up plan:   Return in about 6 weeks (around 10/26/2022).       B/L GN RFA 09/14/22    Recent Visits Date Type Provider Dept  08/27/22 Office Visit Edward Jolly, MD [facility]  Showing recent visits within past 90 days and meeting all other requirements Today's Visits Date Type Provider Dept  09/14/22 Procedure visit Edward Jolly, MD [facility]  Showing today's visits and meeting all other requirements Future Appointments Date Type Provider Dept  10/26/22 Appointment Edward Jolly, MD [facility]  Showing future appointments within next 90 days and meeting all other requirements  Disposition: Discharge home  Discharge (Date  Time): 09/14/2022; 0934 hrs.   Primary Care Physician: [name], NP (Inactive) Location: [facility] Note by: Edward Jolly, MD (TTS technology used. I apologize for any typographical errors that were not detected and corrected.) Date: 09/14/2022; Time: 9:48 AM  Disclaimer:  Medicine is not an Visual merchandiser.  The only guarantee in medicine is that nothing  is guaranteed. It is important to note that the decision to proceed with this intervention was based on the information collected from the patient. The Data and conclusions were drawn from the patient's questionnaire, the interview, and the physical examination. Because the information was provided in large part by the patient, it cannot be guaranteed that it has not been purposely or unconsciously manipulated. Every effort has been made to obtain as much relevant data as possible for this evaluation. It is important to note that the conclusions that lead to this procedure are derived in large part from the available data. Always take into account that the treatment will also be dependent on availability of resources and existing treatment guidelines, considered by other Pain Management Practitioners as being common knowledge and practice, at the time of the intervention. For Medico-Legal purposes, it is also important to point out that variation in procedural techniques and pharmacological choices are the acceptable norm. The indications, contraindications, technique, and results of the above procedure should only be interpreted and judged by a Board-Certified Interventional Pain Specialist with extensive familiarity and expertise in the same exact procedure and technique.

## 2022-09-15 ENCOUNTER — Telehealth: Payer: Self-pay

## 2022-09-15 NOTE — Telephone Encounter (Signed)
Post procedure follow up.  Patient states she is doing ok.  

## 2022-10-26 ENCOUNTER — Ambulatory Visit: Payer: Medicaid Other | Admitting: Student in an Organized Health Care Education/Training Program

## 2022-10-28 ENCOUNTER — Encounter: Payer: Self-pay | Admitting: Student in an Organized Health Care Education/Training Program

## 2022-10-28 ENCOUNTER — Ambulatory Visit
Payer: Medicaid Other | Attending: Student in an Organized Health Care Education/Training Program | Admitting: Student in an Organized Health Care Education/Training Program

## 2022-10-28 ENCOUNTER — Other Ambulatory Visit: Payer: Self-pay | Admitting: Student in an Organized Health Care Education/Training Program

## 2022-10-28 VITALS — BP 132/86 | HR 89 | Temp 97.3°F | Resp 16 | Ht 66.0 in | Wt 217.0 lb

## 2022-10-28 DIAGNOSIS — G8929 Other chronic pain: Secondary | ICD-10-CM | POA: Diagnosis present

## 2022-10-28 DIAGNOSIS — M170 Bilateral primary osteoarthritis of knee: Secondary | ICD-10-CM | POA: Diagnosis present

## 2022-10-28 DIAGNOSIS — G894 Chronic pain syndrome: Secondary | ICD-10-CM | POA: Diagnosis present

## 2022-10-28 DIAGNOSIS — M25561 Pain in right knee: Secondary | ICD-10-CM | POA: Insufficient documentation

## 2022-10-28 DIAGNOSIS — M25562 Pain in left knee: Secondary | ICD-10-CM | POA: Diagnosis present

## 2022-10-28 MED ORDER — DICLOFENAC SODIUM 75 MG PO TBEC: 75.0000 mg | DELAYED_RELEASE_TABLET | Freq: Two times a day (BID) | ORAL | 0 refills | Status: AC

## 2022-10-28 NOTE — Progress Notes (Signed)
PROVIDER NOTE: Information contained herein reflects review and annotations entered in association with encounter. Interpretation of such information and data should be left to medically-trained personnel. Information provided to patient can be located elsewhere in the medical record under "Patient Instructions". Document created using STT-dictation technology, any transcriptional errors that may result from process are unintentional.    Patient: Melanie Romero  Service Category: E/M  Provider: Edward Jolly, MD  DOB: 09-May-1965  DOS: 10/28/2022  Referring Provider: No ref. provider found  MRN: [id number]  Specialty: Interventional Pain Management  PCP: [name], NP (Inactive)  Type: Established Patient  Setting: Ambulatory outpatient    Location: Office  Delivery: Face-to-face     HPI  [name], a 57 y.o. year old female, is here today because of her Bilateral primary osteoarthritis of knee [M17.0]. [name] primary complain today is Knee Pain (Bilateral )  Pertinent problems: [name] has Bilateral primary osteoarthritis of knee; Chronic pain of both knees; Chronic pain syndrome; and Carpal tunnel syndrome of left wrist on their pertinent problem list. Pain Assessment: Severity of Chronic pain is reported as a 8 /10. Location: Knee Left, Right/up into the thighs as well as down to both ankles.. Onset: More than a month ago. Quality: Aching, Burning, Constant, Discomfort, Sharp, Other (Comment) (stiff and difficult to bend.). Timing: Constant. Modifying factor(s): medications, rest. Vitals:  height is 5\' 6"  (1.676 m) and weight is 217 lb (98.4 kg). Her temporal temperature is 97.3 F (36.3 C) (abnormal). Her blood pressure is 132/86 and her pulse is 89. Her respiration is 16 and oxygen saturation is 98%.  BMI: Estimated body mass index is 35.02 kg/m as calculated from the following:   Height as of this encounter: 5\' 6"  (1.676 m).   Weight as of this encounter: 217 lb (98.4 kg). Last  encounter: 08/27/2022. Last procedure: 09/14/2022.  Reason for encounter: post-procedure evaluation and assessment.    Post-procedure evaluation    Procedure:          Anesthesia, Analgesia, Anxiolysis:  Type: Therapeutic Superolateral, Superomedial, and Inferomedial, Genicular Nerve Radiofrequency Ablation (destruction).   #1  Region: Lateral, Anterior, and Medial aspects of the knee joint, above and below the knee joint proper. Level: Superior and inferior to the knee joint. Laterality: Bilateral  Anesthesia: Local (1-2% Lidocaine)  Anxiolysis: None  Sedation: Minimal 2 mg IV Versed  Guidance: Fluoroscopy           Position: Supine   Indications: 1. Bilateral primary osteoarthritis of knee   2. Chronic pain of both knees    Ms. Muhammad has been dealing with the above chronic pain for longer than three months and has either failed to respond, was unable to tolerate, or simply did not get enough benefit from other more conservative therapies including, but not limited to: 1. Over-the-counter medications 2. Anti-inflammatory medications 3. Muscle relaxants 4. Membrane stabilizers 5. Opioids 6. Physical therapy and/or chiropractic manipulation 7. Modalities (Heat, ice, etc.) 8. Invasive techniques such as nerve blocks. [name] has attained more than 50% relief of the pain from a series of diagnostic injections conducted in separate occasions.  Pain Score: Pre-procedure: 10-Worst pain ever/10 Post-procedure: 0-No pain/10     Effectiveness:  Initial hour after procedure: 25 %  Subsequent 4-6 hours post-procedure: 25 %  Analgesia past initial 6 hours: 0 % (no improvement in either knee)  Ongoing improvement:  Analgesic:  0%    Constitutional: Denies any fever or chills Gastrointestinal: No reported hemesis, hematochezia, vomiting,  or acute GI distress Musculoskeletal:  Bilateral knee pain Neurological: No reported episodes of acute onset apraxia, aphasia, dysarthria,  agnosia, amnesia, paralysis, loss of coordination, or loss of consciousness  Medication Review  DULoxetine, HYDROcodone-acetaminophen, aluminum-magnesium hydroxide-simethicone, amLODipine, cetirizine, diclofenac, fluticasone, gabapentin, hydrochlorothiazide, ibuprofen, omega-3 fish oil, omeprazole, ondansetron, tiZANidine, and triamcinolone ointment  History Review  Allergy: Ms. Hetman is allergic to celebrex [celecoxib]. Drug: Ms. Miyasato  reports no history of drug use. Alcohol:  reports no history of alcohol use. Tobacco:  reports that she has never smoked. She has never used smokeless tobacco. Social: Ms. Doelling  reports that she has never smoked. She has never used smokeless tobacco. She reports that she does not drink alcohol and does not use drugs. Medical:  has a past medical history of Arthritis, Headache, and Hypertension. Surgical: Ms. Raden  has a past surgical history that includes No past surgeries. Family: family history includes Arthritis in her son; Congestive Heart Failure in her brother and brother; Heart disease in her mother; Heart failure in her mother; Hypertension in her daughter; Neuropathy in her sister; Other in her sister; Stroke in her brother and father.  Laboratory Chemistry Profile   Renal Lab Results  Component Value Date   BUN 12 03/08/2016   CREATININE 0.71 03/08/2016   GFRAA >60 03/08/2016   GFRNONAA >60 03/08/2016    Hepatic Lab Results  Component Value Date   AST 23 03/08/2016   ALT 19 03/08/2016   ALBUMIN 4.3 03/08/2016   ALKPHOS 60 03/08/2016   LIPASE 25 03/08/2016    Electrolytes Lab Results  Component Value Date   NA 137 03/08/2016   K 3.7 03/08/2016   CL 100 (L) 03/08/2016   CALCIUM 8.8 (L) 03/08/2016    Bone No results found for: "VD25OH", "VD125OH2TOT", "DG3875IE3", "PI9518AC1", "25OHVITD1", "25OHVITD2", "25OHVITD3", "TESTOFREE", "TESTOSTERONE"  Inflammation (CRP: Acute Phase) (ESR: Chronic Phase) No results found for: "CRP",  "ESRSEDRATE", "LATICACIDVEN"       Note: Above Lab results reviewed.  Recent Imaging Review  DG [facility] C-ARM 1-60 MIN NO REPORT Fluoro was used, but no Radiologist interpretation will be provided.  Please refer to "NOTES" tab for provider progress note. Note: Reviewed        Physical Exam  General appearance: Well nourished, well developed, and well hydrated. In no apparent acute distress Mental status: Alert, oriented x 3 (person, place, & time)       Respiratory: No evidence of acute respiratory distress Eyes: PERLA Vitals: BP 132/86 (BP Location: Right Arm, Patient Position: Sitting, Cuff Size: Large)   Pulse 89   Temp (!) 97.3 F (36.3 C) (Temporal)   Resp 16   Ht 5\' 6"  (1.676 m)   Wt 217 lb (98.4 kg)   LMP  (LMP Unknown)   SpO2 98%   BMI 35.02 kg/m  BMI: Estimated body mass index is 35.02 kg/m as calculated from the following:   Height as of this encounter: 5\' 6"  (1.676 m).   Weight as of this encounter: 217 lb (98.4 kg). Ideal: Ideal body weight: 59.3 kg (130 lb 11.7 oz) Adjusted ideal body weight: 75 kg (165 lb 3.8 oz)  Lower Extremity Exam    Side: Right lower extremity  Side: Left lower extremity  Stability: No instability observed          Stability: No instability observed          Skin & Extremity Inspection: Skin color, temperature, and hair growth are WNL. No peripheral edema or cyanosis.  No masses, redness, swelling, asymmetry, or associated skin lesions. No contractures.  Skin & Extremity Inspection: Skin color, temperature, and hair growth are WNL. No peripheral edema or cyanosis. No masses, redness, swelling, asymmetry, or associated skin lesions. No contractures.  Functional ROM: Pain restricted ROM for knee joint          Functional ROM: Pain restricted ROM for knee joint          Muscle Tone/Strength: Functionally intact. No obvious neuro-muscular anomalies detected.  Muscle Tone/Strength: Functionally intact. No obvious neuro-muscular anomalies  detected.  Sensory (Neurological): Arthropathic arthralgia        Sensory (Neurological): Arthropathic arthralgia        DTR: Patellar: deferred today Achilles: deferred today Plantar: deferred today  DTR: Patellar: deferred today Achilles: deferred today Plantar: deferred today  Palpation: No palpable anomalies  Palpation: No palpable anomalies    Assessment   Diagnosis Status  1. Bilateral primary osteoarthritis of knee   2. Chronic pain of both knees   3. Chronic pain syndrome    Deteriorating Worsening Worsening     Plan of Care    Ms. Candle Latterell has a current medication list which includes the following long-term medication(s): amlodipine, cetirizine, duloxetine, fluticasone, gabapentin, hydrochlorothiazide, and omeprazole.   Unfortunately, no benefit with genicular nerve radiofrequency ablation after having positive diagnostic genicular nerve block.  Patient continues to endorse severe knee pain that is worse with weightbearing and more pronounced in the medial compartment.  X-rays of bilateral knees show tricompartmental osteoarthritis that is worse towards the medial aspect with loss of joint space and spurs noted.  At this point, I recommend further workup via MRI of bilateral knee to assess for any meniscus injury or ligamentous injury.  I will discuss results and subsequent treatment plan with her after the MRIs.  Pharmacotherapy (Medications Ordered): Meds ordered this encounter  Medications   diclofenac (VOLTAREN) 75 MG EC tablet    Sig: Take 1 tablet (75 mg total) by mouth 2 (two) times daily after a meal.    Dispense:  60 tablet    Refill:  0   Orders:  Orders Placed This Encounter  Procedures   MR KNEE RIGHT WO CONTRAST    Standing Status:   Future    Standing Expiration Date:   11/27/2022    Scheduling Instructions:     Please make sure that the patient understands that this needs to be done as soon as possible. Never have the patient do the imaging  "just before the next appointment". Inform patient that having the imaging done within the [facility] will expedite the availability of the results and will provide      imaging availability to the requesting physician. In addition inform the patient that the imaging order has an expiration date and will not be renewed if not done within the active period.    Order Specific Question:   What is the patient's sedation requirement?    Answer:   No Sedation    Order Specific Question:   Does the patient have a pacemaker or implanted devices?    Answer:   No    Order Specific Question:   Preferred imaging location?    Answer:   [facility] (table limit-350lbs)    Order Specific Question:   Call Results- Best Contact Number?    Answer:   [phone] [facility])    Order Specific Question:   Radiology Contrast Protocol - do NOT remove file path  Answer:   \\[email or web address]   MR KNEE LEFT WO CONTRAST    Standing Status:   Future    Standing Expiration Date:   11/27/2022    Scheduling Instructions:     Please make sure that the patient understands that this needs to be done as soon as possible. Never have the patient do the imaging "just before the next appointment". Inform patient that having the imaging done within the [facility] will expedite the availability of the results and will provide      imaging availability to the requesting physician. In addition inform the patient that the imaging order has an expiration date and will not be renewed if not done within the active period.    Order Specific Question:   What is the patient's sedation requirement?    Answer:   No Sedation    Order Specific Question:   Does the patient have a pacemaker or implanted devices?    Answer:   No    Order Specific Question:   Preferred imaging location?    Answer:   [facility] (table limit-350lbs)    Order Specific Question:   Call Results- Best Contact  Number?    Answer:   [phone] [facility])    Order Specific Question:   Radiology Contrast Protocol - do NOT remove file path    Answer:   \\[email or web address]   Follow-up plan:   Return for i will call you with results.      B/L GN RFA 09/14/22     Recent Visits Date Type Provider Dept  09/14/22 Procedure visit Edward Jolly, MD [facility]  08/27/22 Office Visit Edward Jolly, MD [facility]  Showing recent visits within past 90 days and meeting all other requirements Today's Visits Date Type Provider Dept  10/28/22 Office Visit Edward Jolly, MD [facility]  Showing today's visits and meeting all other requirements Future Appointments No visits were found meeting these conditions. Showing future appointments within next 90 days and meeting all other requirements  I discussed the assessment and treatment plan with the patient. The patient was provided an opportunity to ask questions and all were answered. The patient agreed with the plan and demonstrated an understanding of the instructions.  Patient advised to call back or seek an in-person evaluation if the symptoms or condition worsens.  Duration of encounter: .  Total time on encounter, as per AMA guidelines included both the face-to-face and non-face-to-face time personally spent by the physician and/or other qualified health care professional(s) on the day of the encounter (includes time in activities that require the physician or other qualified health care professional and does not include time in activities normally performed by clinical staff). Physician's time may include the following activities when performed: Preparing to see the patient (e.g., pre-charting review of records, searching for previously ordered imaging, lab work, and nerve conduction tests) Review of prior analgesic pharmacotherapies. Reviewing PMP Interpreting ordered tests  (e.g., lab work, imaging, nerve conduction tests) Performing post-procedure evaluations, including interpretation of diagnostic procedures Obtaining and/or reviewing separately obtained history Performing a medically appropriate examination and/or evaluation Counseling and educating the patient/family/caregiver Ordering medications, tests, or procedures Referring and communicating with other health care professionals (when not separately reported) Documenting clinical information in the electronic or other health record Independently interpreting results (not separately reported) and communicating results to the patient/ family/caregiver Care coordination (not separately reported)  Note by: Edward Jolly, MD Date: 10/28/2022; Time: 3:40 PM

## 2022-11-05 ENCOUNTER — Ambulatory Visit
Admission: RE | Admit: 2022-11-05 | Discharge: 2022-11-05 | Disposition: A | Discharge status: Home / Self Care | Payer: Medicaid Other | Source: Ambulatory Visit | Attending: Student in an Organized Health Care Education/Training Program | Admitting: Student in an Organized Health Care Education/Training Program

## 2022-11-05 DIAGNOSIS — G8929 Other chronic pain: Secondary | ICD-10-CM | POA: Diagnosis present

## 2022-11-05 DIAGNOSIS — M25561 Pain in right knee: Secondary | ICD-10-CM | POA: Insufficient documentation

## 2022-11-05 DIAGNOSIS — G894 Chronic pain syndrome: Secondary | ICD-10-CM | POA: Diagnosis present

## 2022-11-05 DIAGNOSIS — M170 Bilateral primary osteoarthritis of knee: Secondary | ICD-10-CM

## 2022-11-05 DIAGNOSIS — M25562 Pain in left knee: Secondary | ICD-10-CM | POA: Diagnosis present

## 2022-11-16 ENCOUNTER — Ambulatory Visit
Payer: Medicaid Other | Attending: Student in an Organized Health Care Education/Training Program | Admitting: Student in an Organized Health Care Education/Training Program

## 2022-11-16 DIAGNOSIS — G894 Chronic pain syndrome: Secondary | ICD-10-CM | POA: Diagnosis not present

## 2022-11-16 DIAGNOSIS — G8929 Other chronic pain: Secondary | ICD-10-CM

## 2022-11-16 DIAGNOSIS — M25561 Pain in right knee: Secondary | ICD-10-CM | POA: Diagnosis not present

## 2022-11-16 DIAGNOSIS — M25562 Pain in left knee: Secondary | ICD-10-CM

## 2022-11-16 DIAGNOSIS — M170 Bilateral primary osteoarthritis of knee: Secondary | ICD-10-CM

## 2022-11-16 NOTE — Progress Notes (Signed)
Patient: Melanie Romero  Service Category: E/M  Provider: Edward Jolly, MD  DOB: 1965-08-12  DOS: 11/16/2022  Location: Office  MRN: [id number]  Setting: Ambulatory outpatient  Referring Provider: No ref. provider found  Type: Established Patient  Specialty: Interventional Pain Management  PCP: [name], NP (Inactive)  Location: Remote location  Delivery: TeleHealth     Virtual Encounter - Pain Management PROVIDER NOTE: Information contained herein reflects review and annotations entered in association with encounter. Interpretation of such information and data should be left to medically-trained personnel. Information provided to patient can be located elsewhere in the medical record under "Patient Instructions". Document created using STT-dictation technology, any transcriptional errors that may result from process are unintentional.    Contact & Pharmacy Preferred: [phone] Home: [phone] (home) Mobile: [phone] (mobile) E-mail: [email or web address]  [id number] [place], [place] - [address] [place] [address] Phone: [phone] Fax: [phone]   Pre-screening  Melanie Romero offered "in-person" vs "virtual" encounter. She indicated preferring virtual for this encounter.   Reason COVID-19*  Social distancing based on CDC and AMA recommendations.   I contacted Melanie Romero on 11/16/2022 via telephone.      I clearly identified myself as Edward Jolly, MD. I verified that I was speaking with the correct person using two identifiers (Name: Melanie Romero, and date of birth: [birth year]).  Consent I sought verbal advanced consent from Melanie Romero for virtual visit interactions. I informed Melanie Romero of possible security and privacy concerns, risks, and limitations associated with providing "not-in-person" medical evaluation and management services. I also informed Melanie Romero of the availability of "in-person" appointments. Finally, I  informed her that there would be a charge for the virtual visit and that she could be  personally, fully or partially, financially responsible for it. Melanie Romero expressed understanding and agreed to proceed.   Historic Elements   Ms. Melanie Romero is a 57 y.o. year old, female patient evaluated today after our last contact on 10/28/2022. Ms. Melanie Romero  has a past medical history of Arthritis, Headache, and Hypertension. She also  has a past surgical history that includes No past surgeries. Ms. Melanie Romero has a current medication list which includes the following prescription(s): aluminum-magnesium hydroxide-simethicone, amlodipine, cetirizine, diclofenac, duloxetine, fluticasone, gabapentin, hydrochlorothiazide, hydrocodone-acetaminophen, ibuprofen, omega-3 fish oil, omeprazole, ondansetron, tizanidine, and triamcinolone ointment. She  reports that she has never smoked. She has never used smokeless tobacco. She reports that she does not drink alcohol and does not use drugs. Ms. Melanie Romero is allergic to celebrex [celecoxib].  BMI: Estimated body mass index is 35.02 kg/m as calculated from the following:   Height as of 10/28/22: 5\' 6"  (1.676 m).   Weight as of 10/28/22: 217 lb (98.4 kg). Last encounter: 10/28/2022. Last procedure: 09/14/2022.  HPI  Today, she is being contacted for  review MRI of bilateral knee  Laboratory Chemistry Profile   Renal Lab Results  Component Value Date   BUN 12 03/08/2016   CREATININE 0.71 03/08/2016   GFRAA >60 03/08/2016   GFRNONAA >60 03/08/2016    Hepatic Lab Results  Component Value Date   AST 23 03/08/2016   ALT 19 03/08/2016   ALBUMIN 4.3 03/08/2016   ALKPHOS 60 03/08/2016   LIPASE 25 03/08/2016    Electrolytes Lab Results  Component Value Date   NA 137 03/08/2016   K 3.7 03/08/2016   CL 100 (L) 03/08/2016   CALCIUM 8.8 (L) 03/08/2016    Bone No results found for: "VD25OH", "VD125OH2TOT", "QQ5956LO7", "  ZO1096EA5", "25OHVITD1", "25OHVITD2", "25OHVITD3",  "TESTOFREE", "TESTOSTERONE"  Inflammation (CRP: Acute Phase) (ESR: Chronic Phase) No results found for: "CRP", "ESRSEDRATE", "LATICACIDVEN"       Note: Above Lab results reviewed.  Imaging  MR KNEE LEFT WO CONTRAST CLINICAL DATA:  Chronic left knee pain.  EXAM: MRI OF THE LEFT KNEE WITHOUT CONTRAST  TECHNIQUE: Multiplanar, multisequence MR imaging of the knee was performed. No intravenous contrast was administered.  COMPARISON:  None Available.  FINDINGS: MENISCI  Medial: Complex tear of the posterior horn of the medial meniscus with peripheral meniscal extrusion.  Lateral: Suggestion of partial discoid lateral meniscus with a vertical tear at the anterior horn-body junction and posterior horn-body junction towards the free edge. Degeneration of the root of the posterior horn of the lateral meniscus.  LIGAMENTS  Cruciates: Intact ACL and PCL. ACL is severely expanded and increased in signal as can be seen with mucinous degeneration. 8 mm ganglion cyst along the posterior margin of the ACL origin.  Collaterals: Medial collateral ligament is intact. Lateral collateral ligament complex is intact.  CARTILAGE  Patellofemoral: High-grade partial-thickness cartilage loss of the patellofemoral compartment with marginal osteophytes.  Medial: Extensive full-thickness cartilage loss of the medial femorotibial compartment with subchondral reactive marrow edema and small marginal osteophytes.  Lateral: Partial thickness cartilage loss of the lateral femorotibial compartment.  JOINT: Moderate joint effusion. Edema in Hoffa's fat. No plical thickening.  POPLITEAL FOSSA: Popliteus tendon is intact. No Baker's cyst.  EXTENSOR MECHANISM: Intact quadriceps tendon. Intact patellar tendon. Intact lateral patellar retinaculum. Intact medial patellar retinaculum. Intact MPFL.  BONES: No aggressive osseous lesion. No fracture or dislocation. Bone marrow edema along the roof of  the intercondylar notch likely reactive secondary to mucinous degeneration of the ACL.  Other: No fluid collection or hematoma. Muscles are normal.  IMPRESSION: 1. Complex tear of the posterior horn of the medial meniscus with peripheral meniscal extrusion. 2. Suggestion of partial discoid lateral meniscus with a vertical tear at the anterior horn-body junction and posterior horn-body junction towards the free edge. Degeneration of the root of the posterior horn of the lateral meniscus. 3. Tricompartmental cartilage abnormalities as described above most severe in the medial femorotibial compartment.  Electronically Signed   By: Elige Ko M.D.   On: 11/16/2022 07:44 MR KNEE RIGHT WO CONTRAST CLINICAL DATA:  Chronic right knee pain.  EXAM: MRI OF THE RIGHT KNEE WITHOUT CONTRAST  TECHNIQUE: Multiplanar, multisequence MR imaging of the knee was performed. No intravenous contrast was administered.  COMPARISON:  None Available.  FINDINGS: MENISCI  Medial: Small superior surface tear of the posterior horn of the medial meniscus. Complex tear of the body of the medial meniscus. Peripheral meniscal extrusion.  Lateral: Small undersurface tear of the anterior horn of the lateral meniscus. Degeneration of the anterior horn of the lateral meniscus.  LIGAMENTS  Cruciates: Intact ACL and PCL. Severe expansion of the ACL with increased signal consistent with mucinous degeneration. 6 mm ganglion cyst along the posterior margin of the ACL origin.  Collaterals: Medial collateral ligament is intact. Lateral collateral ligament complex is intact.  CARTILAGE  Patellofemoral: Partial-thickness cartilage loss of the lateral patellofemoral compartment with subchondral reactive marrow edema. Full thickness cartilage fissure involving the lateral patellar facet. Cartilage fissuring of the medial patellar facet with partial thickness cartilage loss. Partial-thickness cartilage loss of  the medial trochlea.  Medial: Extensive full-thickness cartilage loss of the medial femorotibial compartment with mild subchondral reactive marrow edema in the medial tibial plateau. Small marginal osteophytes.  Lateral: Mild partial-thickness cartilage loss of the lateral femorotibial compartment. Small marginal osteophytes.  JOINT: Small joint effusion. Mild edema in Hoffa's fat. No plical thickening.  POPLITEAL FOSSA: Popliteus tendon is intact. Tiny Baker's cyst.  EXTENSOR MECHANISM: Intact quadriceps tendon. Intact patellar tendon. Intact lateral patellar retinaculum. Intact medial patellar retinaculum. Intact MPFL.  BONES: No aggressive osseous lesion. No fracture or dislocation.  Other: No fluid collection or hematoma. Muscles are normal.  IMPRESSION: 1. Small superior surface tear of the posterior horn of the medial meniscus. Complex tear of the body of the medial meniscus. Peripheral meniscal extrusion. 2. Small undersurface tear of the anterior horn of the lateral meniscus. 3. Tricompartmental cartilage abnormalities as described above most severe in the medial femorotibial compartment. Severe medial femorotibial compartment osteoarthritis.  Electronically Signed   By: Elige Ko M.D.   On: 11/16/2022 07:40  Assessment  The primary encounter diagnosis was Bilateral primary osteoarthritis of knee. Diagnoses of Chronic pain of both knees and Chronic pain syndrome were also pertinent to this visit.  Plan of Care  Reviewed bilateral knee MRIs with the patient.  She has bilateral medial meniscus tears along with severe osteoarthritis.  I recommend that she follow-up with [facility] with her orthopedic surgeon to discuss options for knee surgery: Arthroscopic versus knee replacement  Follow-up plan:   No follow-ups on file.      B/L GN RFA 09/14/22    Recent Visits Date Type Provider Dept  10/28/22 Office Visit Edward Jolly, MD [facility]   09/14/22 Procedure visit Edward Jolly, MD [facility]  08/27/22 Office Visit Edward Jolly, MD [facility]  Showing recent visits within past 90 days and meeting all other requirements Today's Visits Date Type Provider Dept  11/16/22 Appointment Edward Jolly, MD [facility]  Showing today's visits and meeting all other requirements Future Appointments No visits were found meeting these conditions. Showing future appointments within next 90 days and meeting all other requirements  I discussed the assessment and treatment plan with the patient. The patient was provided an opportunity to ask questions and all were answered. The patient agreed with the plan and demonstrated an understanding of the instructions.  Patient advised to call back or seek an in-person evaluation if the symptoms or condition worsens.  Duration of encounter: .  Note by: Edward Jolly, MD Date: 11/16/2022; Time: 3:48 PM

## 2023-03-25 IMAGING — CT CT HEAD W/O CM
4 series · 15 of 47 positions shown, 17 images · non-contrast
Comparison: None.

CLINICAL DATA: Daily right-sided headache. Weakness. Symptoms for 2
years.

Migraine without status migrainous.
EXAM:
CT HEAD WITHOUT CONTRAST
TECHNIQUE: Contiguous axial images were obtained from the base of the skull
through the vertex without intravenous contrast.

[Series 2: head 5.00 hr40 s3 axial ibhc · axial · 0.44mm/px · z∈[-620,-500]mm · 7 of 32 slices shown, 9 images]
[im 4/32  brain]
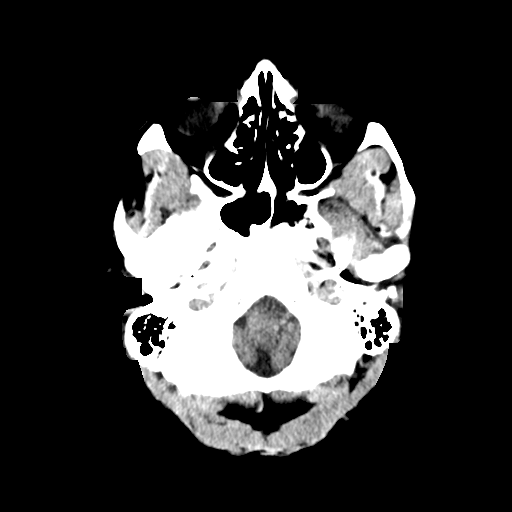
[im 4/32  bone]
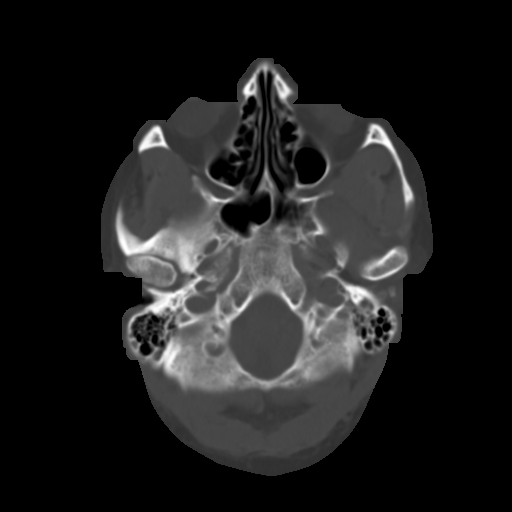
[im 8/32  brain]
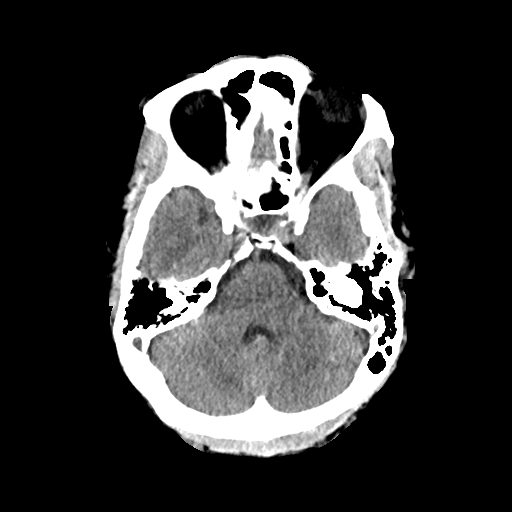
[im 12/32  brain]
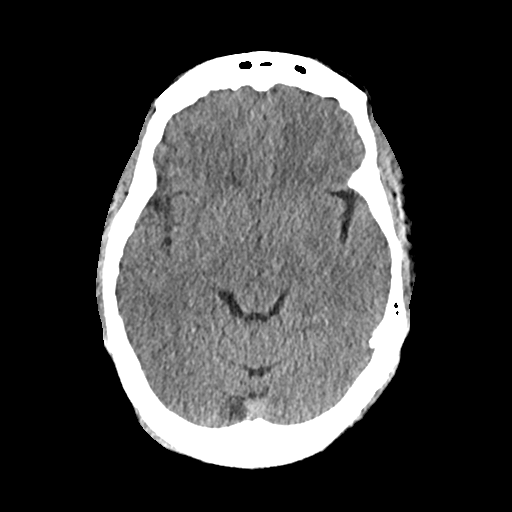
[im 16/32  brain]
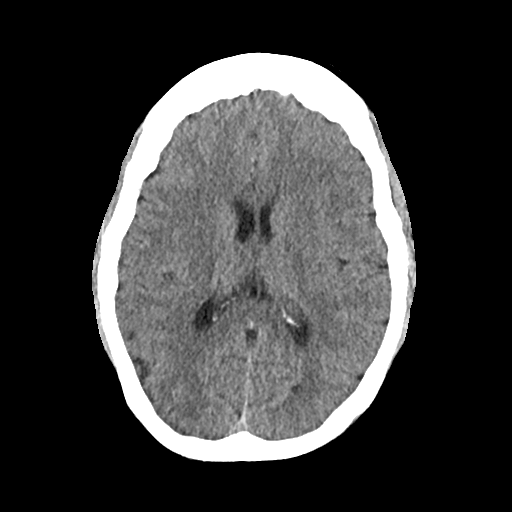
[im 20/32  brain]
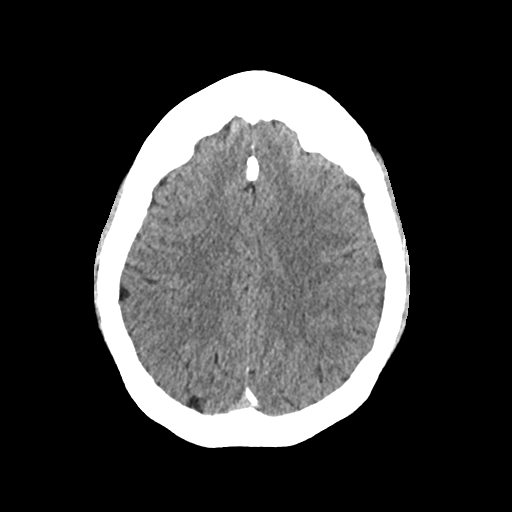
[im 20/32  bone]
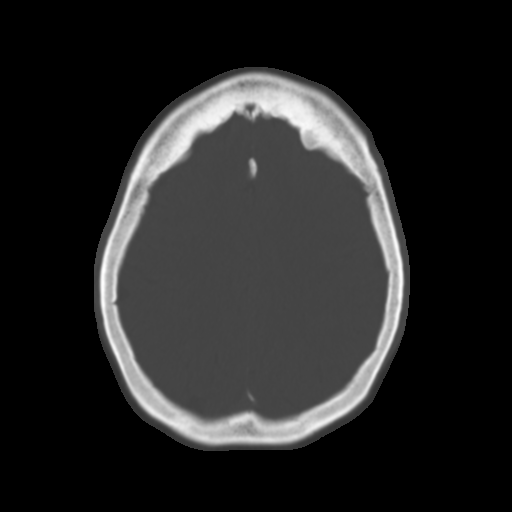
[im 24/32  brain]
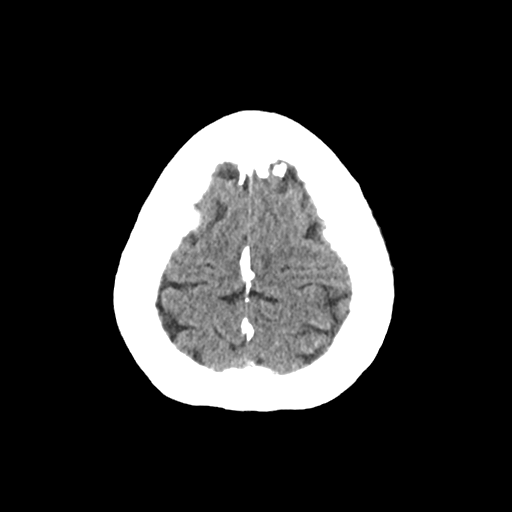
[im 28/32  brain]
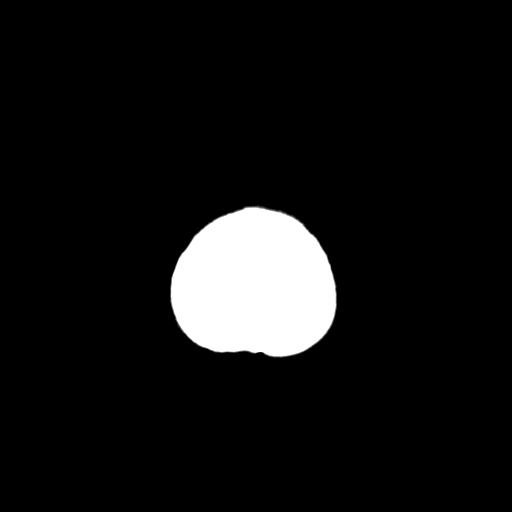

[Series 3: head 2.00 hr60 s3 axial bone · axial · 0.44mm/px · z∈[-622,-606]mm · 2 of 80 slices shown]
[im 8/80  bone]
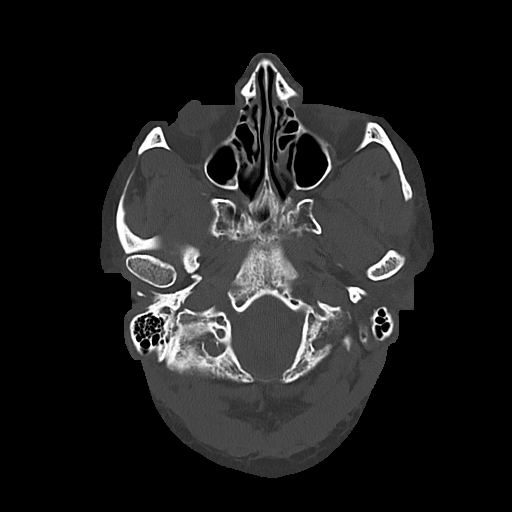
[im 16/80  bone]
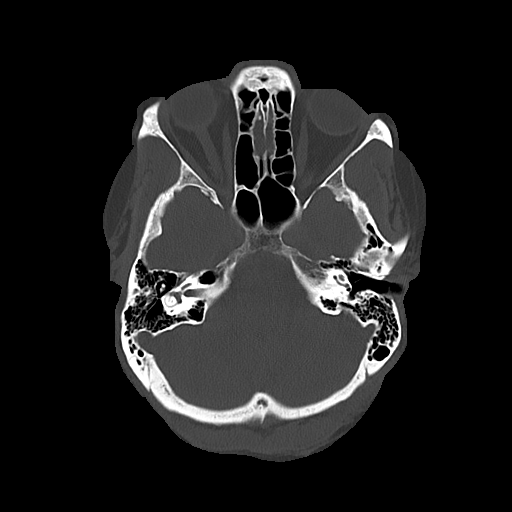

[Series 4: head 3.00 hr40 s3 sag · sagittal · 0.31mm/px · 3 of 75 slices shown]
[im 25/75  brain]
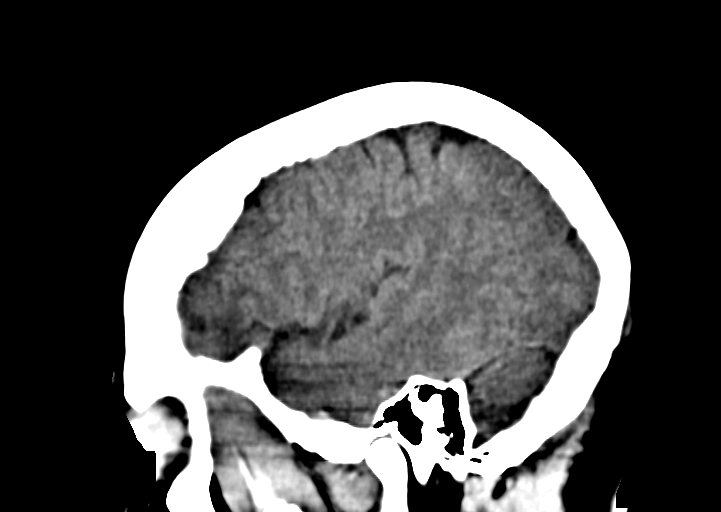
[im 38/75  brain]
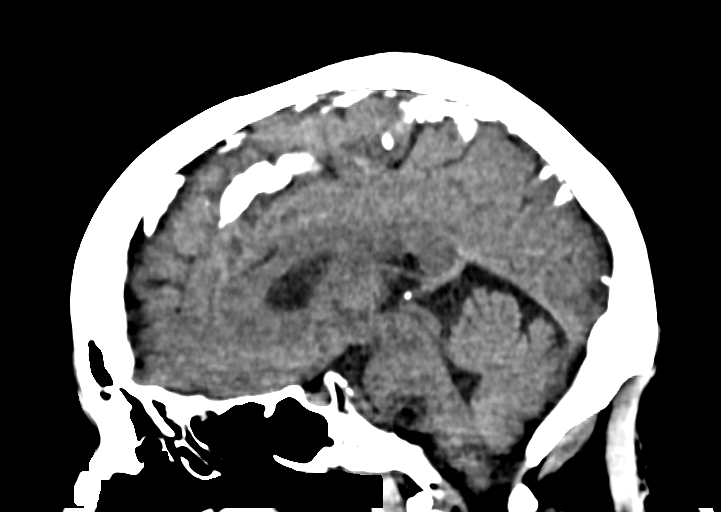
[im 50/75  brain]
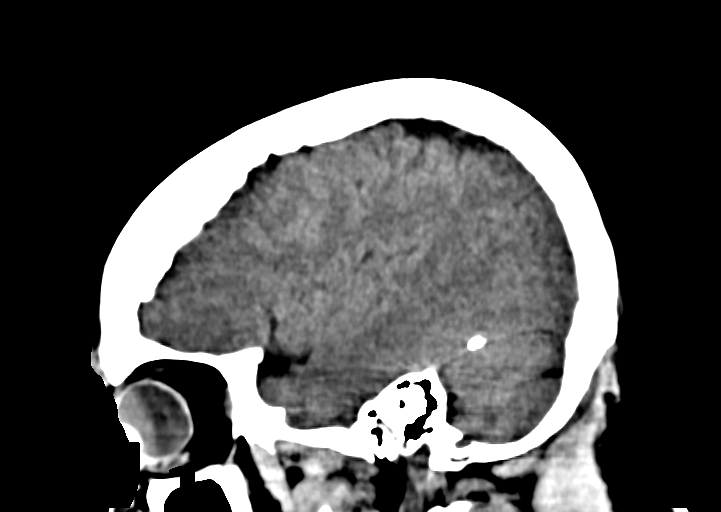

[Series 6: head 3.00 hr40 s3 cor · coronal · 0.31mm/px · 3 of 75 slices shown]
[im 25/75  brain]
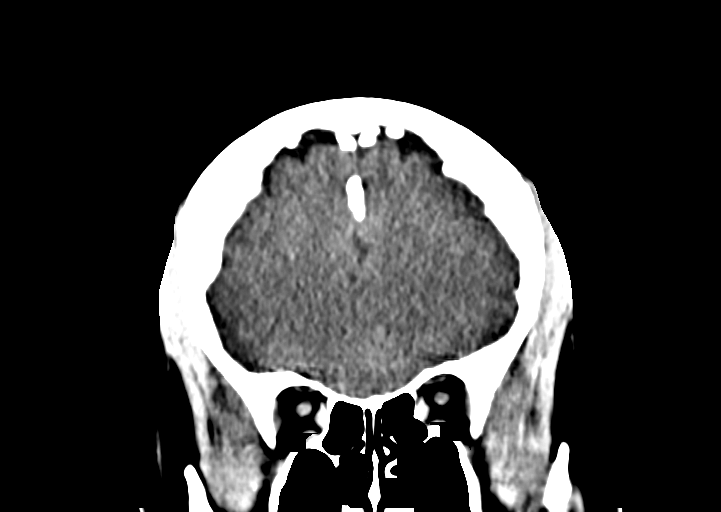
[im 33/75  brain]
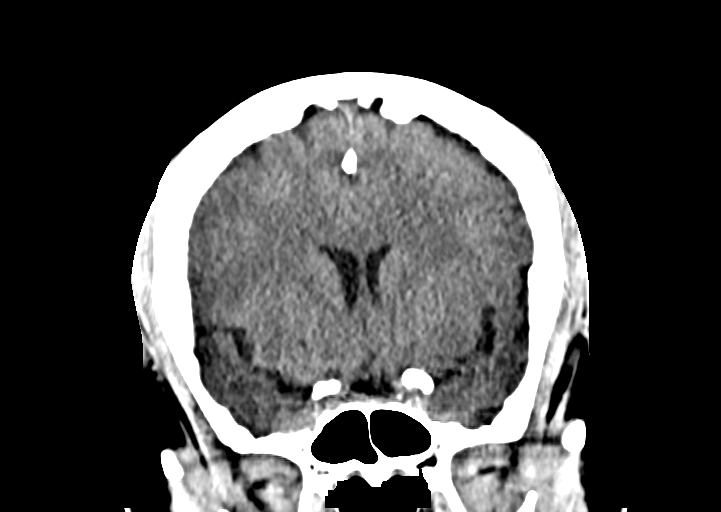
[im 42/75  brain]
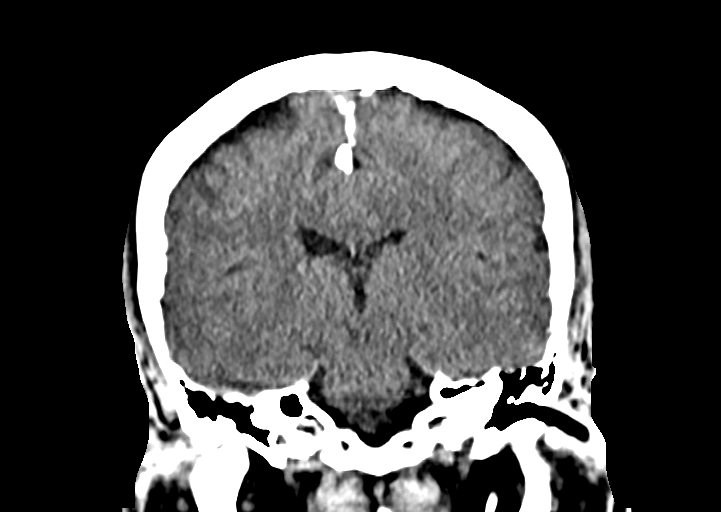

[15 of 47 positions shown; findings below may reference images not displayed]

FINDINGS: Brain: Brain volume is normal for age. No evidence of mass
lesion/mass effect. No hydrocephalus. No acute or prior infarct. No
hemorrhage, extra-axial collection, or midline shift. Scattered
dural calcifications typically incidental.

Vascular: No hyperdense vessel.

Skull: Normal. Negative for fracture or focal lesion.

Sinuses/Orbits: Paranasal sinuses and mastoid air cells are clear.
The visualized orbits are unremarkable.

Other: None.
IMPRESSION: Negative noncontrast head CT.  No explanation for symptoms.

## 2023-07-26 ENCOUNTER — Other Ambulatory Visit: Payer: Self-pay | Admitting: Internal Medicine

## 2023-07-26 DIAGNOSIS — R7401 Elevation of levels of liver transaminase levels: Secondary | ICD-10-CM

## 2023-07-27 ENCOUNTER — Ambulatory Visit
Admission: RE | Admit: 2023-07-27 | Discharge: 2023-07-27 | Disposition: A | Discharge status: Home / Self Care | Source: Ambulatory Visit | Attending: Internal Medicine | Admitting: Internal Medicine

## 2023-07-27 DIAGNOSIS — R7401 Elevation of levels of liver transaminase levels: Secondary | ICD-10-CM

## 2024-01-25 ENCOUNTER — Other Ambulatory Visit

## 2024-01-25 ENCOUNTER — Encounter: Payer: Self-pay | Admitting: Gastroenterology

## 2024-01-25 ENCOUNTER — Ambulatory Visit: Admitting: Gastroenterology

## 2024-01-25 VITALS — BP 120/80 | HR 87 | Ht 66.0 in | Wt 226.0 lb

## 2024-01-25 DIAGNOSIS — K5909 Other constipation: Secondary | ICD-10-CM | POA: Diagnosis not present

## 2024-01-25 DIAGNOSIS — K5900 Constipation, unspecified: Secondary | ICD-10-CM

## 2024-01-25 DIAGNOSIS — K760 Fatty (change of) liver, not elsewhere classified: Secondary | ICD-10-CM

## 2024-01-25 DIAGNOSIS — K219 Gastro-esophageal reflux disease without esophagitis: Secondary | ICD-10-CM | POA: Diagnosis not present

## 2024-01-25 DIAGNOSIS — R079 Chest pain, unspecified: Secondary | ICD-10-CM | POA: Diagnosis not present

## 2024-01-25 DIAGNOSIS — R0602 Shortness of breath: Secondary | ICD-10-CM | POA: Diagnosis not present

## 2024-01-25 LAB — COMPREHENSIVE METABOLIC PANEL WITH GFR
ALT: 28 U/L (ref 3–35)
AST: 20 U/L (ref 5–37)
Albumin: 4.2 g/dL (ref 3.5–5.2)
Alkaline Phosphatase: 79 U/L (ref 39–117)
BUN: 15 mg/dL (ref 6–23)
CO2: 32 meq/L (ref 19–32)
Calcium: 9.5 mg/dL (ref 8.4–10.5)
Chloride: 99 meq/L (ref 96–112)
Creatinine, Ser: 0.82 mg/dL (ref 0.40–1.20)
GFR: 79.5 mL/min
Glucose, Bld: 103 mg/dL — ABNORMAL HIGH (ref 70–99)
Potassium: 3.2 meq/L — ABNORMAL LOW (ref 3.5–5.1)
Sodium: 141 meq/L (ref 135–145)
Total Bilirubin: 0.3 mg/dL (ref 0.2–1.2)
Total Protein: 7.4 g/dL (ref 6.0–8.3)

## 2024-01-25 NOTE — Progress Notes (Signed)
 "  Discussed the use of AI scribe software for clinical note transcription with the patient, who gave verbal consent to proceed.  HPI : Melanie Romero is a 58 year old female with fatty liver, gastroesophageal reflux, and chronic constipation who presents for evaluation of recurrent chest and abdominal pain.  Recurrent episodes of pain are localized to the lower chest and rib cage, occurring suddenly during light activity such as standing or attending to a patient. Pain is not severe but is followed by significant shortness of breath, requiring her to sit and rest for several minutes before resuming activity. Each pain episode lasts a few seconds, while shortness of breath persists for 2-3 minutes. Symptoms have been present for approximately two and a half years, with periods of remission lasting months, but have recurred about once per week over the past month. Shortness of breath does not occur without pain.  Frequent nausea is reported, sometimes triggered by the smell of certain foods, leading to increased salivation and an urge to vomit. Zofran  is used as needed for nausea. Famotidine 20 mg daily is taken for reflux symptoms; omeprazole was previously used but discontinued. Vomiting is denied, as she takes Zofran  promptly to avoid it.  Reflux symptoms have occurred despite medication. Endoscopy and colonoscopy in August 2023 were normal.  Multiple tests for H. pylori have been negative. Abdominal ultrasound in June 2023 showed mild fatty change in the liver without gallstones. Liver enzymes were elevated at that time but normalized after discontinuing Voltaren ; she has since resumed Voltaren  for arthritis and also takes tramadol.  Bowel movements are irregular; Miralax is used as needed to maintain regularity, and senna is avoided.  Additional history includes arthritis with bilateral knee pain, prediabetes, hypertension (on medication), and venous insufficiency. She does not take medication for  hyperlipidemia. Echocardiogram in 2023 was reportedly normal except for some valvular regurgitation. She does not smoke and drinks alcohol only occasionally. Family history is significant for heart disease.      AST and ALT were in the 200s in June.  She was advised by her PCP to stop Voltaren .  Liver enzymes were repeated in August and had normalized.  She has no prior history of chronically elevated liver enzymes.  Right upper quadrant ultrasound June 2025 IMPRESSION: 1. No acute abnormality identified. 2. Increased echotexture of the liver. This is a nonspecific finding but can be seen in fatty infiltration of liver  Colonoscopy August 2023 Melanosis of the colon, otherwise normal Recommend repeat 10 years  EGD August 2023 Indication:  GERD symptoms not controlled with medication Normal upper endoscopy  Dec 2024 Negative H. Pylori breath test  Mar 2024 Negative H. Pylori breath test  August 2025 Component Ref Range & Units 4 mo ago  PROTEIN, TOTAL, SERUM 6.0 - 8.5 g/dL 7.0  ALBUMIN, SERUM 3.8 - 4.9 g/dL 4.2  BILIRUBIN, TOTAL 0.0 - 1.2 mg/dL 0.4  BILIRUBIN, DIRECT 0.00 - 0.40 mg/dL 9.87  ALKALINE PHOSPHATASE, SERUM 44 - 121 IU/L 78  AST (SGOT) 0 - 40 IU/L 25  ALT (SGPT) 0 - 32 IU/L 27    Past Medical History:  Diagnosis Date   Arthritis    Bilateral legs   Headache    Hypertension      Past Surgical History:  Procedure Laterality Date   NO PAST SURGERIES     Family History  Problem Relation Age of Onset   Heart disease Mother    Heart failure Mother    Stroke Father  Neuropathy Sister    Other Sister        cardiomegaly   Stroke Brother    Congestive Heart Failure Brother    Congestive Heart Failure Brother    Arthritis Son    Hypertension Daughter    Social History[1] Current Outpatient Medications  Medication Sig Dispense Refill   aluminum -magnesium  hydroxide-simethicone  (MAALOX) 200-200-20 MG/5ML SUSP Take 15 mLs by mouth 4 (four) times  daily -  before meals and at bedtime. 1680 mL 0   amLODipine (NORVASC) 5 MG tablet Take 5 mg by mouth daily.     cetirizine (ZYRTEC) 10 MG tablet Take 10 mg by mouth daily.     diclofenac  (VOLTAREN ) 75 MG EC tablet Take 1 tablet (75 mg total) by mouth 2 (two) times daily after a meal. 60 tablet 0   DULoxetine (CYMBALTA) 60 MG capsule Take by mouth.     fluticasone (FLONASE) 50 MCG/ACT nasal spray Place 1 spray into both nostrils as needed for allergies or rhinitis.     gabapentin (NEURONTIN) 300 MG capsule Take 300 mg by mouth 3 (three) times daily.     hydrochlorothiazide (HYDRODIURIL) 50 MG tablet Take 50 mg by mouth daily.     HYDROcodone-acetaminophen  (NORCO) 7.5-325 MG tablet Take 1 tablet by mouth every 4 (four) hours as needed.     ibuprofen (ADVIL) 800 MG tablet ibuprofen 800 mg tablet  TAKE 1 TABLET 3 TIMES A DAY BY ORAL ROUTE.     omega-3 fish oil (MAXEPA) 1000 MG CAPS capsule Take 1 capsule by mouth daily.     omeprazole (PRILOSEC) 20 MG capsule Take 20 mg by mouth daily.     ondansetron  (ZOFRAN -ODT) 4 MG disintegrating tablet Take 4 mg by mouth every 8 (eight) hours as needed for nausea.     tiZANidine (ZANAFLEX) 4 MG tablet Take 4 mg by mouth daily as needed.     triamcinolone ointment (KENALOG) 0.1 % SMARTSIG:sparingly Topical Twice Daily     No current facility-administered medications for this visit.   Allergies[2]   Review of Systems: All systems reviewed and negative except where noted in HPI.    No results found.  Physical Exam: BP 120/80   Pulse 87   Ht 5' 6 (1.676 m)   Wt 226 lb (102.5 kg)   LMP  (LMP Unknown)   BMI 36.48 kg/m  Constitutional: Pleasant,well-developed, African American female in no acute distress. HEENT: Normocephalic and atraumatic. Conjunctivae are normal. No scleral icterus. Neck supple.  Cardiovascular: Normal rate, regular rhythm.  Pulmonary/chest: Effort normal and breath sounds normal. No wheezing, rales or rhonchi. Abdominal:  Soft, nondistended, nontender. Bowel sounds active throughout. There are no masses palpable. No hepatomegaly.  Tenderness to palpation noted along xiphoid process, left inferior costal border Extremities: no edema Neurological: Alert and oriented to person place and time. Skin: Skin is warm and dry. No rashes noted. Psychiatric: Normal mood and affect. Behavior is normal.  CBC    Component Value Date/Time   WBC 5.0 03/08/2016 0733   RBC 4.63 03/08/2016 0733   HGB 13.8 03/08/2016 0733   HCT 39.6 03/08/2016 0733   PLT 248 03/08/2016 0733   MCV 85.6 03/08/2016 0733   MCH 29.7 03/08/2016 0733   MCHC 34.7 03/08/2016 0733   RDW 14.5 03/08/2016 0733    CMP     Component Value Date/Time   NA 137 03/08/2016 0733   K 3.7 03/08/2016 0733   CL 100 (L) 03/08/2016 0733   CO2 28 03/08/2016 0733  GLUCOSE 113 (H) 03/08/2016 0733   BUN 12 03/08/2016 0733   CREATININE 0.71 03/08/2016 0733   CALCIUM 8.8 (L) 03/08/2016 0733   PROT 7.9 03/08/2016 0733   ALBUMIN 4.3 03/08/2016 0733   AST 23 03/08/2016 0733   ALT 19 03/08/2016 0733   ALKPHOS 60 03/08/2016 0733   BILITOT 0.8 03/08/2016 0733   GFRNONAA >60 03/08/2016 0733   GFRAA >60 03/08/2016 0733       Latest Ref Rng & Units 03/08/2016    7:33 AM  CBC EXTENDED  WBC 3.6 - 11.0 K/uL 5.0   RBC 3.80 - 5.20 MIL/uL 4.63   Hemoglobin 12.0 - 16.0 g/dL 86.1   HCT 64.9 - 52.9 % 39.6   Platelets 150 - 440 K/uL 248       ASSESSMENT AND PLAN:  58 year old female who presented for evaluation of recurrent episodic pain in her upper abdomen and chest associated with shortness of breath.  Symptoms typically occur with activity.  Low suspicion for GI source of pain.  She has also had an extensive previous workup which has been unremarkable.  She does have tenderness to palpation at the xiphoid process, possibly suggestive of costochondritis, but given her family history of coronary artery disease as well as risk factors of obesity and hypertension, I  recommended she see a cardiologist to see if any additional evaluation is recommended.  Chest pain and exertional shortness of breath Chronic exertional chest pain and dyspnea with nausea, increased frequency. Cardiac etiology concerning due to family history and risk factors; costochondritis and gastrointestinal causes also considered. - Referred to cardiology for further evaluation.  Fatty liver Chronic hepatic steatosis likely metabolic, no cirrhosis or advanced liver disease. Prior liver enzyme elevation likely NSAID-related, now normalized. Assessment for hepatic fibrosis needed. - Ordered right upper quadrant elastography for hepatic fibrosis assessment. - Ordered repeat comprehensive metabolic panel to monitor liver enzymes.  Gastroesophageal reflux disease Chronic symptoms managed with famotidine and ondansetron . Endoscopy showed no significant abnormalities.  Constipation Chronic constipation. Avoids senna; bowel movements irregular but manageable.  Recording duration: 20 minutes     I spent a total of 35 minutes reviewing the patient's medical record, interviewing and examining the patient, discussing her diagnosis and management of her condition going forward, and documenting in the medical record    Buck Search, PA-C     [1]  Social History Tobacco Use   Smoking status: Never   Smokeless tobacco: Never  Vaping Use   Vaping status: Never Used  Substance Use Topics   Alcohol use: Never   Drug use: Never  [2]  Allergies Allergen Reactions   Celebrex [Celecoxib] Itching   "

## 2024-01-25 NOTE — Patient Instructions (Addendum)
 _______________________________________________________  If your blood pressure at your visit was 140/90 or greater, please contact your primary care physician to follow up on this.  _______________________________________________________  If you are age 57 or older, your body mass index should be between 23-30. Your Body mass index is 36.48 kg/m. If this is out of the aforementioned range listed, please consider follow up with your Primary Care Provider.  If you are age 55 or younger, your body mass index should be between 19-25. Your Body mass index is 36.48 kg/m. If this is out of the aformentioned range listed, please consider follow up with your Primary Care Provider.   ________________________________________________________  The  GI providers would like to encourage you to use MYCHART to communicate with providers for non-urgent requests or questions.  Due to long hold times on the telephone, sending your provider a message by [facility] may be a faster and more efficient way to get a response.  Please allow 48 business hours for a response.  Please remember that this is for non-urgent requests.  _______________________________________________________  [facility] is using a team-based approach to care.  Your team is made up of your doctor and two to three APPS. Our APPS (Nurse Practitioners and Physician Assistants) work with your physician to ensure care continuity for you. They are fully qualified to address your health concerns and develop a treatment plan. They communicate directly with your gastroenterologist to care for you. Seeing the Advanced Practice Practitioners on your physician's team can help you by facilitating care more promptly, often allowing for earlier appointments, access to diagnostic testing, procedures, and other specialty referrals.   Your provider has requested that you go to the basement level for lab work before leaving today. Press B on the  elevator. The lab is located at the first door on the left as you exit the elevator.  You have been scheduled for an elastography at [facility] (1st floor of hospital) on 02-01-24 at 730am. Please arrive 15 minutes prior to your appointment for registration. Make certain not to have anything to eat or drink midnight prior to your appointment. Should you need to reschedule your appointment, please contact radiology at [phone]. This test typically takes about 30 minutes to perform.  You have been referred to cardiology. Please call them in 2 weeks if you haven't heard from them.  It was a pleasure to see you today!  Thank you for trusting me with your gastrointestinal care!

## 2024-01-28 ENCOUNTER — Ambulatory Visit: Payer: Self-pay | Admitting: Gastroenterology

## 2024-01-28 NOTE — Progress Notes (Signed)
 Melanie Romero,  Your liver enzymes are still normal.  Your potassium level was a little low but your kidney function is normal.  I would recommend increasing consumption of potassium rich foods, such a bananas.

## 2024-02-01 ENCOUNTER — Ambulatory Visit (HOSPITAL_COMMUNITY)
Admission: RE | Admit: 2024-02-01 | Discharge: 2024-02-01 | Disposition: A | Discharge status: Home / Self Care | Source: Ambulatory Visit | Attending: Gastroenterology | Admitting: Gastroenterology

## 2024-02-01 DIAGNOSIS — K760 Fatty (change of) liver, not elsewhere classified: Secondary | ICD-10-CM | POA: Insufficient documentation

## 2024-02-09 NOTE — Progress Notes (Signed)
 Ms. Decoursey,  Your elastography showed no evidence of worrisome scarring of the liver which is good news.  The accuracy of the study may have been limited based on some of the quality control measures listed, but I think the results overall are very reassuring.  You do have fatty change to the liver which is most likely related to diet and lifestyle habits.  This can be reversed with healthy diet changes to include limiting sugary and processed foods/beverages, increasing fresh fruits and vegetables and weight loss through regular activity. Please continue to completely avoid alcohol.  I would recommend we repeat an ultrasound and elastography in 1 year.  Alan,  Please place recall for repeat US /elastography in 1 year.

## 2024-02-28 ENCOUNTER — Ambulatory Visit: Admitting: Internal Medicine

## 2024-04-13 ENCOUNTER — Ambulatory Visit: Admitting: Internal Medicine
